# Patient Record
Sex: Female | Born: 1960 | Race: White | Hispanic: No | Marital: Married | Smoking: Never smoker
Health system: Southern US, Community
[De-identification: ages and names within clinical notes are randomized; demographics above are authoritative.]

## PROBLEM LIST (undated history)

## (undated) DIAGNOSIS — M899 Disorder of bone, unspecified: Secondary | ICD-10-CM

## (undated) DIAGNOSIS — E079 Disorder of thyroid, unspecified: Secondary | ICD-10-CM

## (undated) DIAGNOSIS — M810 Age-related osteoporosis without current pathological fracture: Secondary | ICD-10-CM

## (undated) DIAGNOSIS — M949 Disorder of cartilage, unspecified: Secondary | ICD-10-CM

## (undated) HISTORY — DX: Disorder of thyroid, unspecified: E07.9

## (undated) HISTORY — PX: COLONOSCOPY: SHX174

## (undated) HISTORY — DX: Disorder of bone, unspecified: M89.9

## (undated) HISTORY — DX: Age-related osteoporosis without current pathological fracture: M81.0

## (undated) HISTORY — DX: Disorder of cartilage, unspecified: M94.9

---

## 1996-04-25 HISTORY — PX: TUBAL LIGATION: SHX77

## 1998-11-05 ENCOUNTER — Other Ambulatory Visit: Admission: RE | Admit: 1998-11-05 | Discharge: 1998-11-05 | Payer: Self-pay | Admitting: Obstetrics and Gynecology

## 1999-12-25 ENCOUNTER — Encounter: Payer: Self-pay | Admitting: Internal Medicine

## 1999-12-25 LAB — HM MAMMOGRAPHY

## 2000-01-12 ENCOUNTER — Other Ambulatory Visit: Admission: RE | Admit: 2000-01-12 | Discharge: 2000-01-12 | Payer: Self-pay | Admitting: Obstetrics and Gynecology

## 2002-03-25 ENCOUNTER — Other Ambulatory Visit: Admission: RE | Admit: 2002-03-25 | Discharge: 2002-03-25 | Payer: Self-pay | Admitting: Obstetrics and Gynecology

## 2003-03-28 ENCOUNTER — Other Ambulatory Visit: Admission: RE | Admit: 2003-03-28 | Discharge: 2003-03-28 | Payer: Self-pay | Admitting: Obstetrics and Gynecology

## 2004-05-03 ENCOUNTER — Other Ambulatory Visit: Admission: RE | Admit: 2004-05-03 | Discharge: 2004-05-03 | Payer: Self-pay | Admitting: Obstetrics and Gynecology

## 2005-05-19 ENCOUNTER — Other Ambulatory Visit: Admission: RE | Admit: 2005-05-19 | Discharge: 2005-05-19 | Payer: Self-pay | Admitting: Obstetrics and Gynecology

## 2006-01-02 ENCOUNTER — Ambulatory Visit: Payer: Self-pay | Admitting: Internal Medicine

## 2006-04-06 ENCOUNTER — Ambulatory Visit: Payer: Self-pay | Admitting: Internal Medicine

## 2006-09-25 ENCOUNTER — Encounter: Payer: Self-pay | Admitting: Internal Medicine

## 2007-01-09 ENCOUNTER — Ambulatory Visit: Payer: Self-pay | Admitting: Internal Medicine

## 2007-01-09 DIAGNOSIS — N943 Premenstrual tension syndrome: Secondary | ICD-10-CM | POA: Insufficient documentation

## 2007-01-09 DIAGNOSIS — E039 Hypothyroidism, unspecified: Secondary | ICD-10-CM | POA: Insufficient documentation

## 2007-01-09 DIAGNOSIS — M899 Disorder of bone, unspecified: Secondary | ICD-10-CM

## 2007-01-09 DIAGNOSIS — M810 Age-related osteoporosis without current pathological fracture: Secondary | ICD-10-CM | POA: Insufficient documentation

## 2007-01-09 DIAGNOSIS — M949 Disorder of cartilage, unspecified: Secondary | ICD-10-CM

## 2007-01-10 LAB — CONVERTED CEMR LAB: Free T4: 0.7 ng/dL (ref 0.6–1.6)

## 2007-03-14 ENCOUNTER — Encounter: Payer: Self-pay | Admitting: Internal Medicine

## 2007-11-16 ENCOUNTER — Ambulatory Visit: Payer: Self-pay | Admitting: Family Medicine

## 2007-11-16 LAB — CONVERTED CEMR LAB
Nitrite: NEGATIVE
Protein, U semiquant: NEGATIVE
Urobilinogen, UA: 0.2
pH: 6.5

## 2007-11-17 ENCOUNTER — Encounter: Payer: Self-pay | Admitting: Family Medicine

## 2008-08-12 ENCOUNTER — Ambulatory Visit: Payer: Self-pay | Admitting: Internal Medicine

## 2008-08-12 DIAGNOSIS — R1033 Periumbilical pain: Secondary | ICD-10-CM | POA: Insufficient documentation

## 2008-08-14 LAB — CONVERTED CEMR LAB
ALT: 21 units/L (ref 0–35)
AST: 24 units/L (ref 0–37)
Albumin: 3.9 g/dL (ref 3.5–5.2)
Basophils Absolute: 0 10*3/uL (ref 0.0–0.1)
Basophils Relative: 0.4 % (ref 0.0–3.0)
Calcium: 9.2 mg/dL (ref 8.4–10.5)
Chloride: 106 meq/L (ref 96–112)
Eosinophils Relative: 1.4 % (ref 0.0–5.0)
HCT: 32.5 % — ABNORMAL LOW (ref 36.0–46.0)
Hemoglobin: 11.1 g/dL — ABNORMAL LOW (ref 12.0–15.0)
Lymphs Abs: 1.5 10*3/uL (ref 0.7–4.0)
Monocytes Relative: 8.4 % (ref 3.0–12.0)
Neutro Abs: 3.6 10*3/uL (ref 1.4–7.7)
Phosphorus: 4.2 mg/dL (ref 2.3–4.6)
Potassium: 4.6 meq/L (ref 3.5–5.1)
RDW: 13.1 % (ref 11.5–14.6)
TSH: 2.23 microintl units/mL (ref 0.35–5.50)
Total Protein: 6.9 g/dL (ref 6.0–8.3)

## 2008-09-04 ENCOUNTER — Ambulatory Visit: Payer: Self-pay | Admitting: Internal Medicine

## 2008-09-04 DIAGNOSIS — R198 Other specified symptoms and signs involving the digestive system and abdomen: Secondary | ICD-10-CM | POA: Insufficient documentation

## 2009-03-06 ENCOUNTER — Encounter: Admission: RE | Admit: 2009-03-06 | Discharge: 2009-03-06 | Payer: Self-pay | Admitting: Obstetrics and Gynecology

## 2011-09-20 IMAGING — MG MM BREAST STEREO BIOPSY*L*
2 series · 2 of 2 positions shown · non-contrast
Comparison: none

Addendum Begins

Histologic evaluation demonstrates fibrocystic changes with
adenosis and microcalcifications.  No malignancy or atypia is
noted.  This is concordant with the imaging findings.  Results were
discussed with the patient and her husband.  She reports no
complications from the procedure.  Her breast was examined.  There
is ecchymosis at the biopsy site but no sign of hematoma or
infection.  Yearly screening mammography is suggested.
Addendum Ends
CLINICAL DATA: Microcalcifications left breast

[L CC]
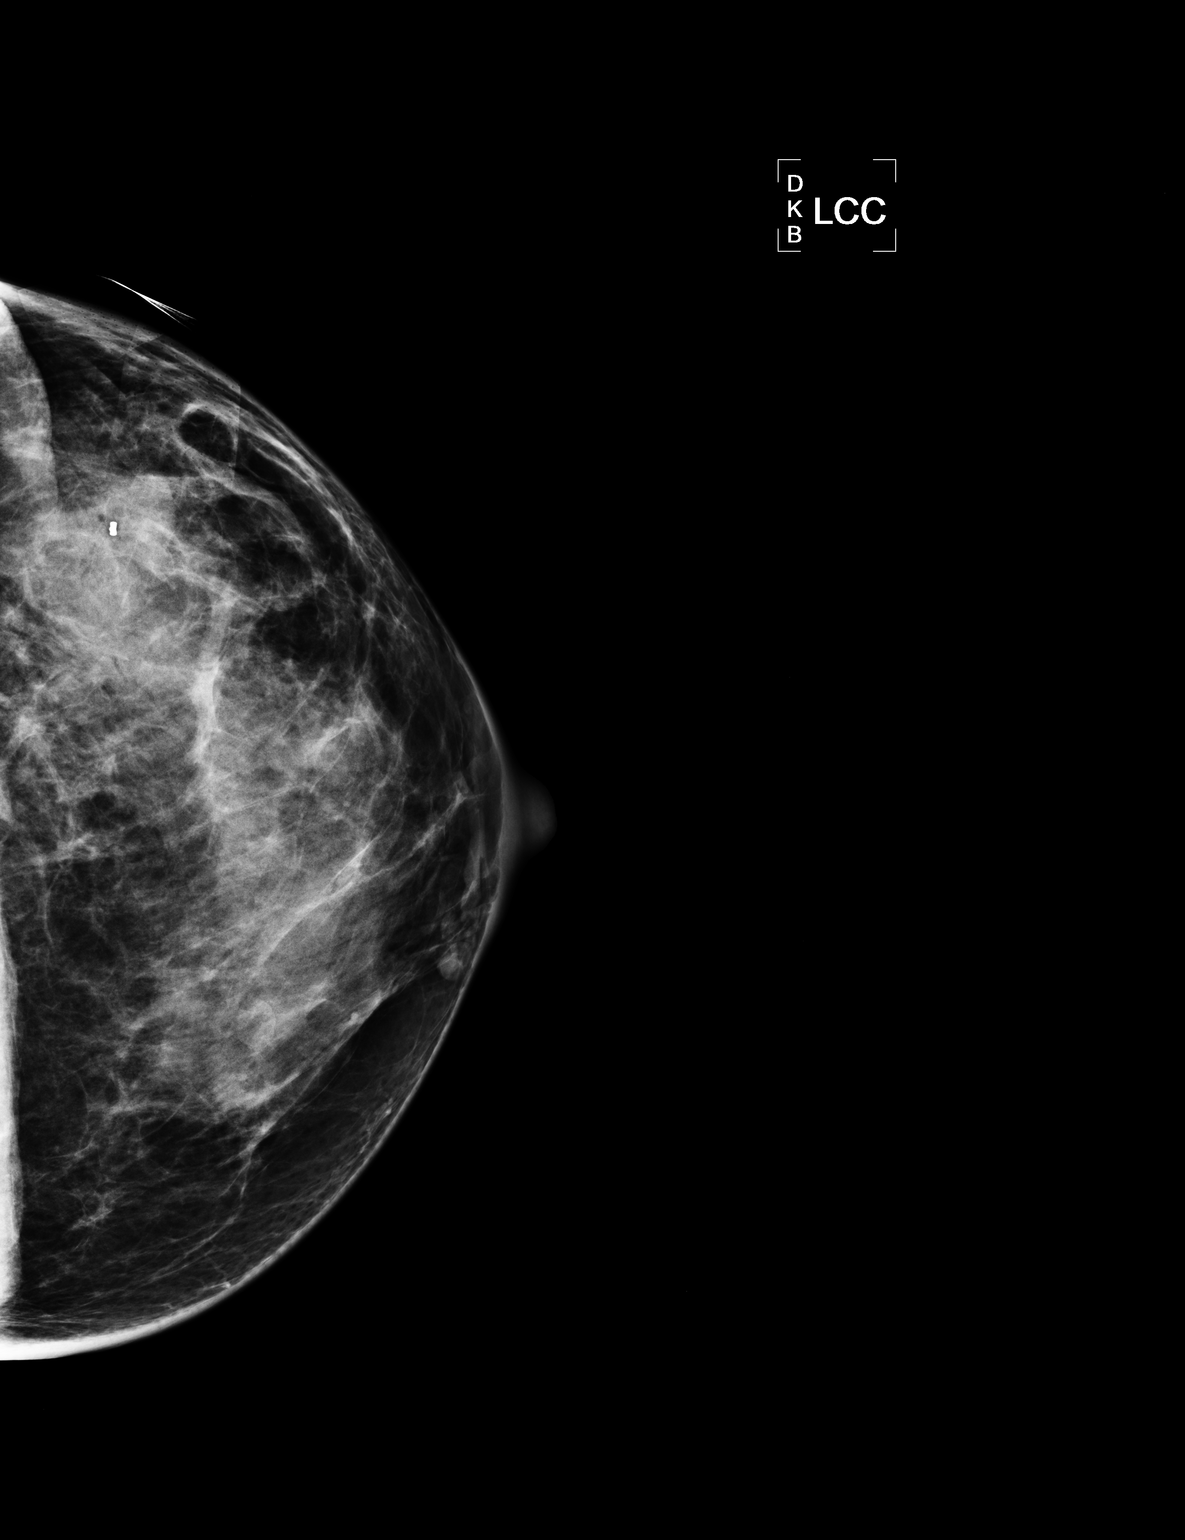

[L ML]
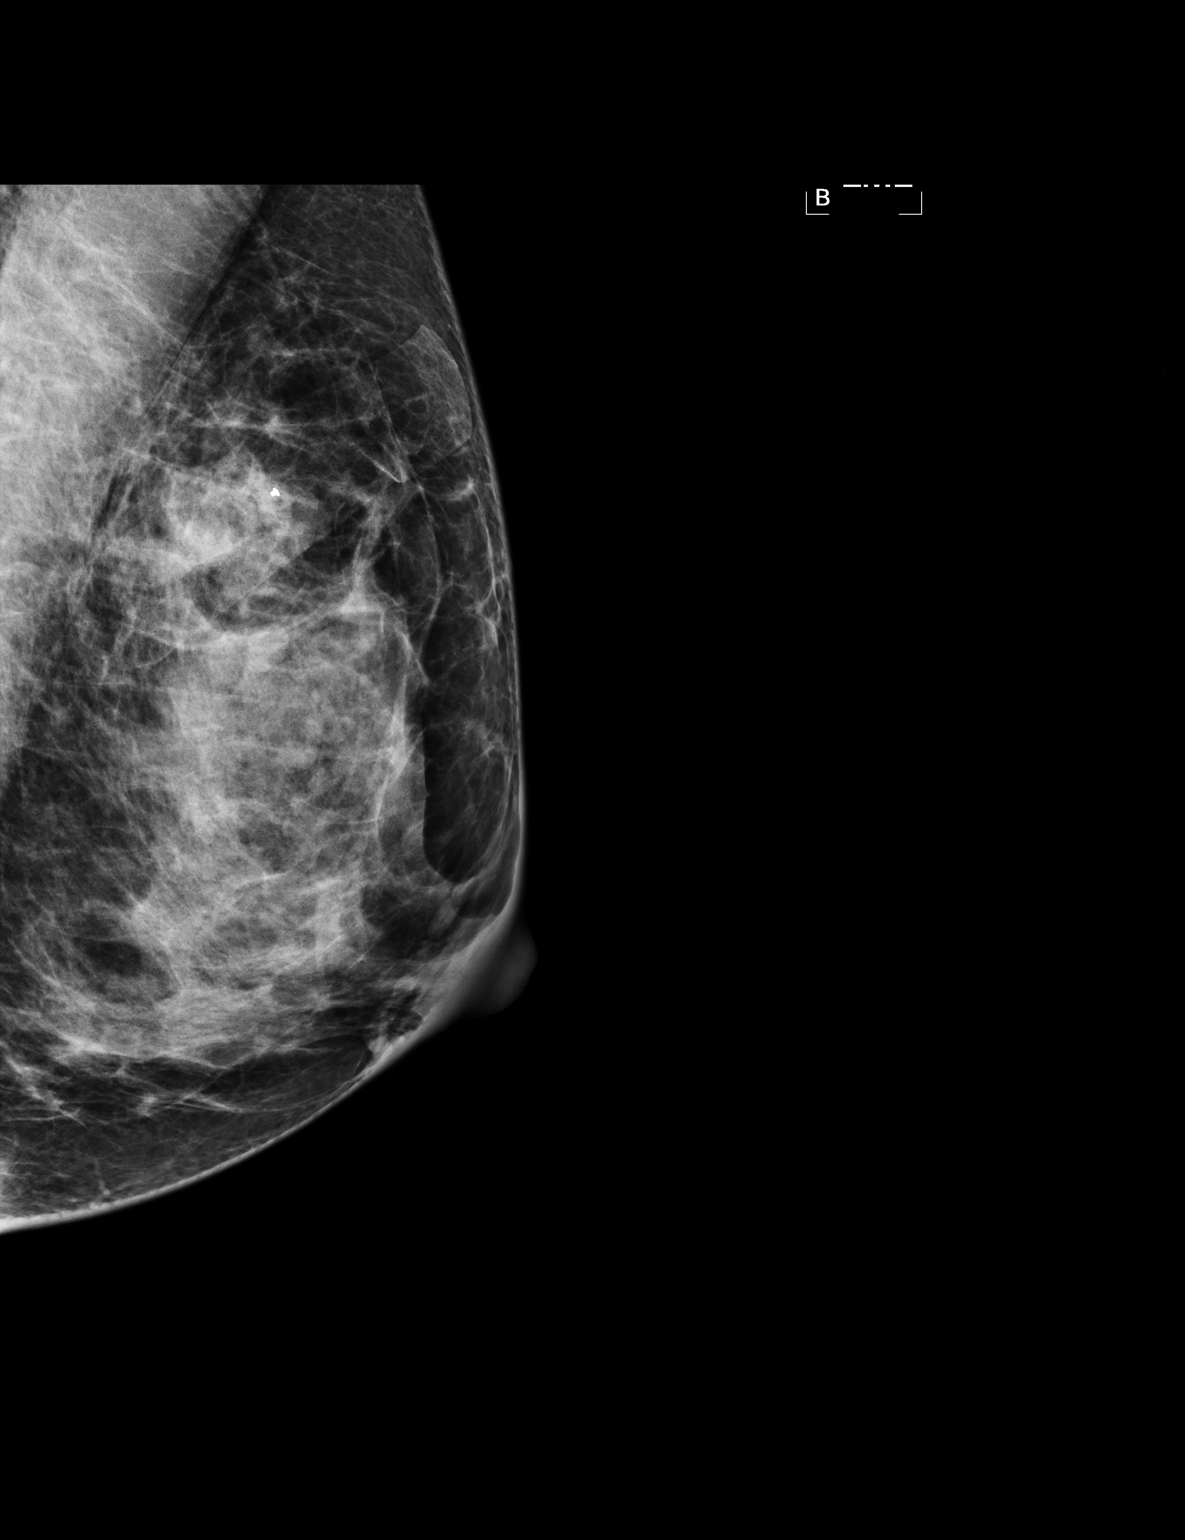

[2 of 2 positions shown; findings below may reference images not displayed]

STEREOTACTIC-GUIDED VACUUM ASSISTED BIOPSY OF THE LEFT BREAST AND
SPECIMEN RADIOGRAPH

I met with the patient, and we discussed the procedure of
stereotactic-guided biopsy, including risks, benefits, and
alternatives.  Specifically, we discussed the risks of infection,
bleeding, tissue injury, clip migration, and inadequate sampling.
Informed, written consent was given.

Using sterile technique, 2% lidocaine, stereotactic guidance, and a
9 gauge vacuum assisted device, biopsy was performed of cluster of
indeterminate microcalcifications upper outer quadrant left breast.
Specimen radiograph was performed, showing inclusion of
calcifications of question within the specimen.  Specimens with
calcifications are identified for pathology.

At the conclusion of the procedure, a tissue marker clip was
deployed into the biopsy cavity.  Follow-up 2-view mammogram
confirmed clip in the area of concern.
IMPRESSION: Stereotactic-guided biopsy of left breast.  No apparent
complications.

## 2011-09-20 IMAGING — MG MM DIAGNOSTIC UNILATERAL L
2 series · 2 of 2 positions shown · non-contrast
Comparison: [DATE] [DATE], [DATE], [DATE] [DATE], [DATE], [DATE] [DATE], [DATE]

CLINICAL DATA: Called back from screening mammogram for
calcifications left breast

DIGITAL DIAGNOSTIC LEFT MAMMOGRAM March 06, 2009

[L CC]
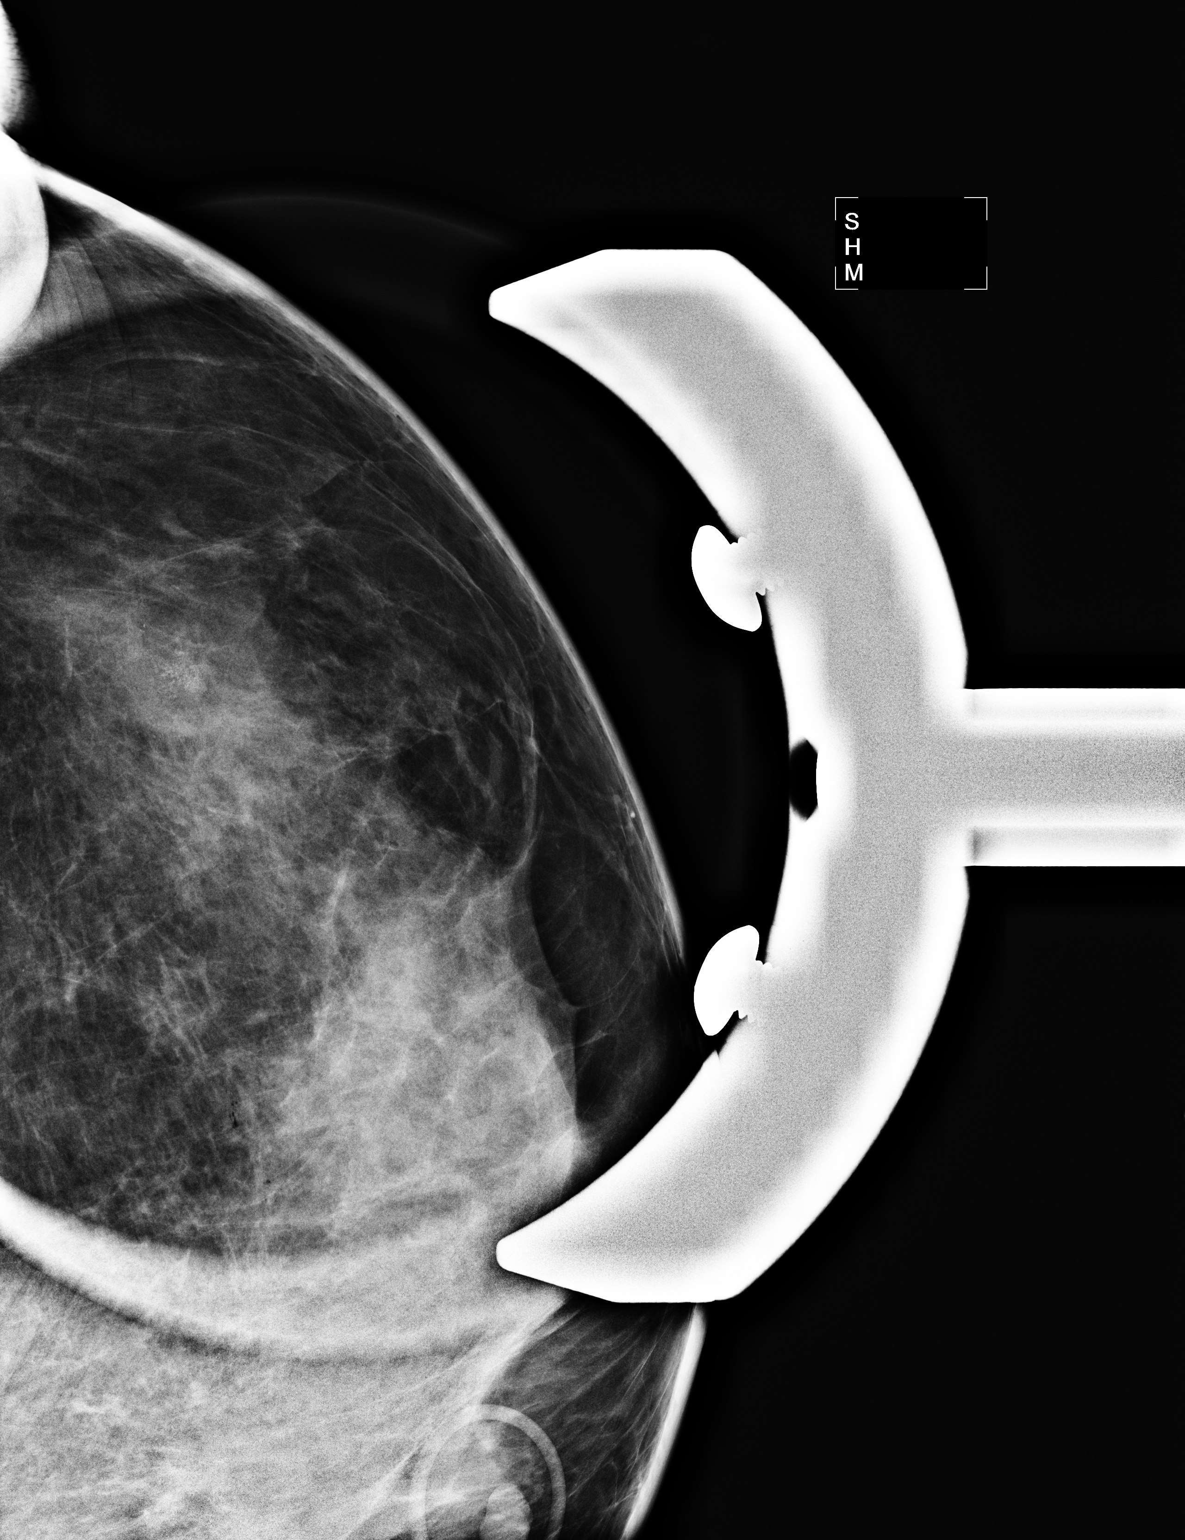

[L ML]
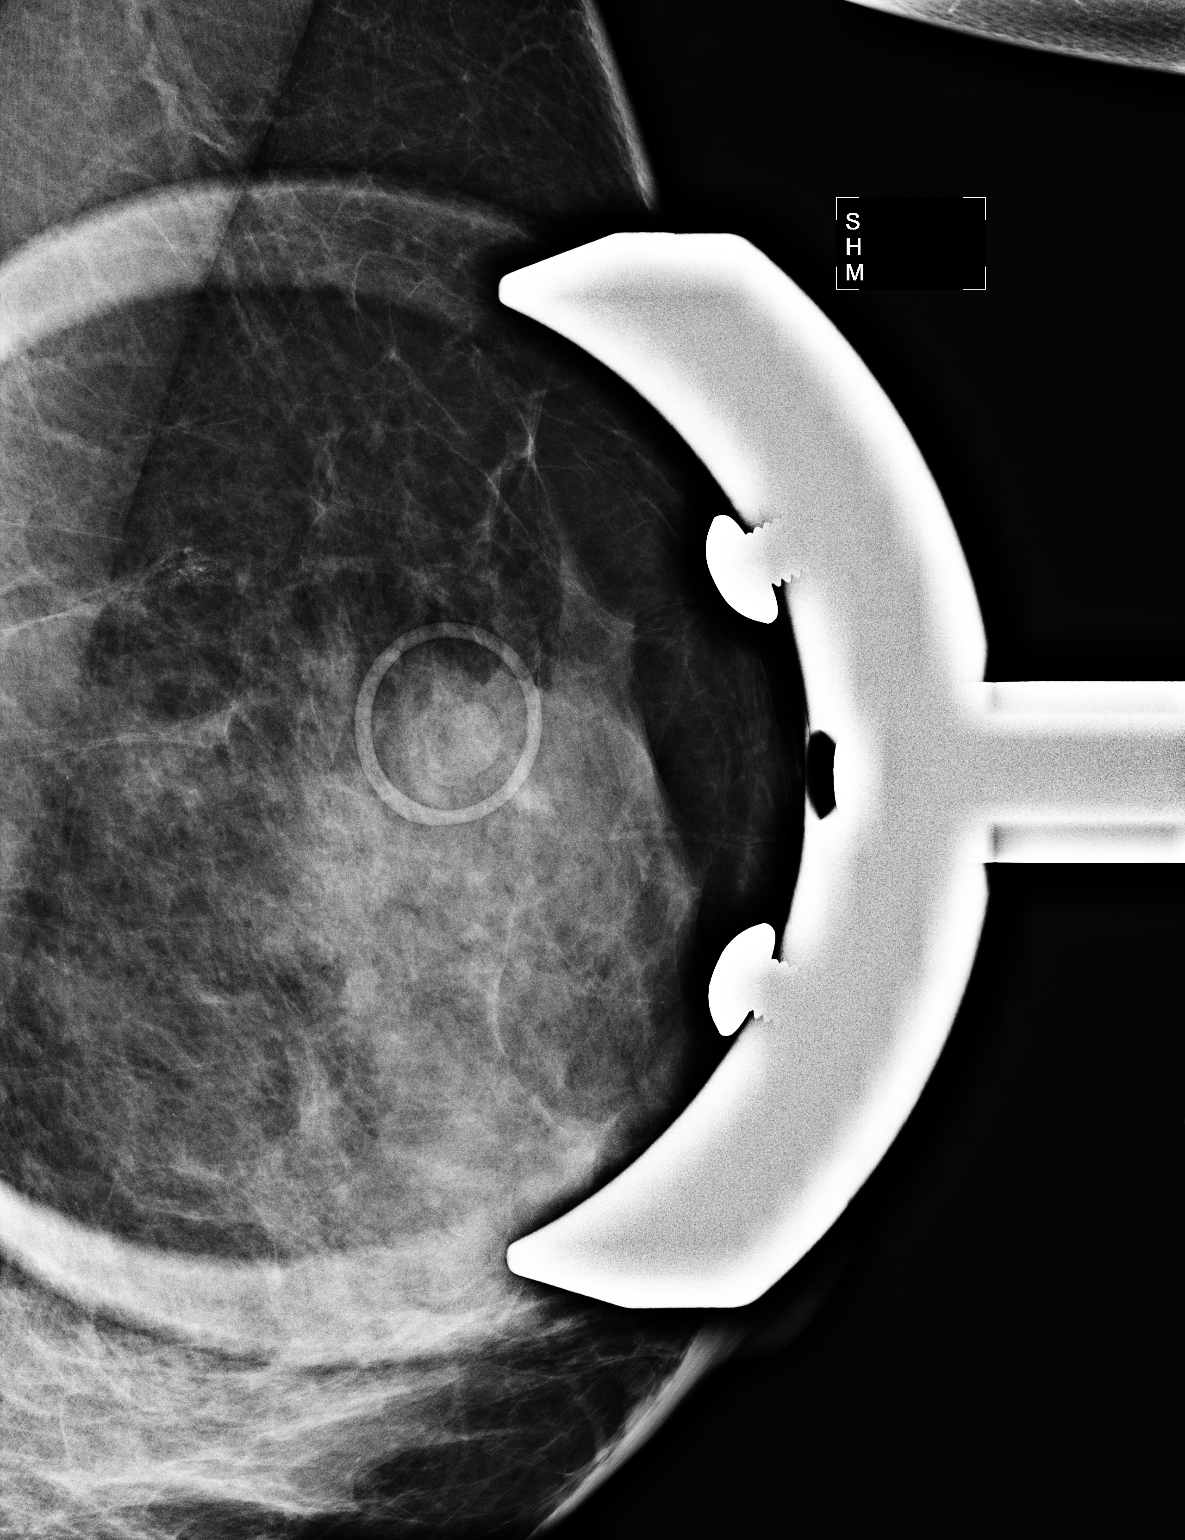

[2 of 2 positions shown; findings below may reference images not displayed]

FINDINGS: Spot magnification CC and lateral view of the left
breast are submitted.  Previously noted calcifications in the
posterior upper outer quadrant left breast are indeterminate.
IMPRESSION: Suspicious findings, recommend stereotactic biopsy of left breast

BI-RADS CATEGORY 4:  Suspicious abnormality - biopsy should be
considered.

## 2012-04-10 ENCOUNTER — Ambulatory Visit (INDEPENDENT_AMBULATORY_CARE_PROVIDER_SITE_OTHER): Payer: BC Managed Care – PPO | Admitting: Internal Medicine

## 2012-04-10 ENCOUNTER — Encounter: Payer: Self-pay | Admitting: Internal Medicine

## 2012-04-10 VITALS — BP 112/70 | HR 70 | Temp 97.5°F | Ht 63.5 in | Wt 114.0 lb

## 2012-04-10 DIAGNOSIS — E039 Hypothyroidism, unspecified: Secondary | ICD-10-CM

## 2012-04-10 DIAGNOSIS — Z1211 Encounter for screening for malignant neoplasm of colon: Secondary | ICD-10-CM

## 2012-04-10 DIAGNOSIS — Z23 Encounter for immunization: Secondary | ICD-10-CM

## 2012-04-10 DIAGNOSIS — Z0000 Encounter for general adult medical examination without abnormal findings: Secondary | ICD-10-CM | POA: Insufficient documentation

## 2012-04-10 LAB — CBC WITH DIFFERENTIAL/PLATELET
Basophils Absolute: 0 10*3/uL (ref 0.0–0.1)
Basophils Relative: 0.3 % (ref 0.0–3.0)
Eosinophils Absolute: 0 10*3/uL (ref 0.0–0.7)
HCT: 33.8 % — ABNORMAL LOW (ref 36.0–46.0)
Hemoglobin: 11 g/dL — ABNORMAL LOW (ref 12.0–15.0)
Lymphs Abs: 1 10*3/uL (ref 0.7–4.0)
MCHC: 32.5 g/dL (ref 30.0–36.0)
Neutro Abs: 3.3 10*3/uL (ref 1.4–7.7)
RDW: 15.3 % — ABNORMAL HIGH (ref 11.5–14.6)

## 2012-04-10 LAB — HEPATIC FUNCTION PANEL
ALT: 15 U/L (ref 0–35)
Albumin: 4.2 g/dL (ref 3.5–5.2)
Alkaline Phosphatase: 61 U/L (ref 39–117)
Total Protein: 7.3 g/dL (ref 6.0–8.3)

## 2012-04-10 LAB — BASIC METABOLIC PANEL
CO2: 26 mEq/L (ref 19–32)
Chloride: 102 mEq/L (ref 96–112)
Glucose, Bld: 92 mg/dL (ref 70–99)
Potassium: 3.9 mEq/L (ref 3.5–5.1)
Sodium: 135 mEq/L (ref 135–145)

## 2012-04-10 LAB — LIPID PANEL: Cholesterol: 169 mg/dL (ref 0–200)

## 2012-04-10 NOTE — Progress Notes (Signed)
Subjective:    Patient ID: Yvonne Hodge, female    DOB: 09/23/1960, 51 y.o.   MRN: [id number]  HPI Here for physical Hasn't been in since 2010--- with the abdominal symptoms Never had the colonoscopy--- cost her $700 she didn't have Abdominal symptoms are better  Still sees Dr Henderson Cloud Seen 12/9 this year Had pap and mammo---all good  Has not been on thyroid meds for about 2 years No changes in hair or nails Some weight loss---relates to nerves with some issues with son  Some pain in neck for about a week Pain with rotation Radiates with tingling into occiput  No current outpatient prescriptions on file prior to visit.    Allergies  Allergen Reactions  . Sulfonamide Derivatives     Past Medical History  Diagnosis Date  . Thyroid disease   . Disorder of bone and cartilage, unspecified     Past Surgical History  Procedure Date  . Cesarean section   . Tubal ligation     Family History  Problem Relation Age of Onset  . Cancer Mother     breast cancer  . Hypothyroidism Mother   . Hypertension Father   . Hypothyroidism Sister     History   Social History  . Marital Status: Married    Spouse Name: N/A    Number of Children: 2  . Years of Education: N/A   Occupational History  . [employer]    Social History Main Topics  . Smoking status: Never Smoker   . Smokeless tobacco: Never Used  . Alcohol Use: Yes  . Drug Use: No  . Sexually Active: Not on file   Other Topics Concern  . Not on file   Social History Narrative  . No narrative on file   Review of Systems  Constitutional: Negative for appetite change and fatigue.       Wears seat belt  HENT: Positive for hearing loss. Negative for congestion, rhinorrhea, dental problem and tinnitus.        Mild hearing problems Regular with dentist  Eyes: Negative for visual disturbance.       No diplopia or unilateral vision loss  Respiratory: Negative for cough, chest  tightness and shortness of breath.   Cardiovascular: Negative for chest pain, palpitations and leg swelling.  Gastrointestinal: Negative for nausea, vomiting, abdominal pain, constipation and blood in stool.       No heartburn  Genitourinary: Negative for urgency, frequency, difficulty urinating and dyspareunia.       Skips some periods now No sig cyclic mood issues Has had occ stress incontinence  Musculoskeletal: Positive for back pain. Negative for joint swelling and arthralgias.       Mild back pain if up on her feet for a long time  Skin: Negative for rash.       No suspicious lesions  Neurological: Positive for headaches. Negative for dizziness, syncope, weakness, light-headedness and numbness.       Some sinus headaches--behind right eye. BCs or sinus med will help  Hematological: Negative for adenopathy. Does not bruise/bleed easily.  Psychiatric/Behavioral: Positive for dysphoric mood. Negative for sleep disturbance. The patient is not nervous/anxious.        Some stress with son       Objective:   Physical Exam  Constitutional: She is oriented to person, place, and time. She appears well-developed and well-nourished. No distress.  HENT:  Head: Normocephalic and atraumatic.  Right Ear: External ear normal.  Left Ear:  External ear normal.  Mouth/Throat: Oropharynx is clear and moist. No oropharyngeal exudate.  Eyes: Conjunctivae normal and EOM are normal. Pupils are equal, round, and reactive to light.  Neck: Normal range of motion. Neck supple. No thyromegaly present.  Cardiovascular: Normal rate, regular rhythm, normal heart sounds and intact distal pulses.  Exam reveals no gallop.   No murmur heard. Pulmonary/Chest: Effort normal and breath sounds normal. No respiratory distress. She has no wheezes. She has no rales.  Abdominal: Soft. There is no tenderness.  Musculoskeletal: She exhibits no edema and no tenderness.  Lymphadenopathy:    She has no cervical adenopathy.   Neurological: She is alert and oriented to person, place, and time.  Skin: No rash noted. No erythema.  Psychiatric: She has a normal mood and affect. Her behavior is normal.          Assessment & Plan:

## 2012-04-10 NOTE — Addendum Note (Signed)
Addended by: Sueanne Margarita on: 04/10/2012 10:49 AM   Modules accepted: Orders

## 2012-04-10 NOTE — Assessment & Plan Note (Signed)
Hasn't been on meds for some time Will check labs

## 2012-04-10 NOTE — Assessment & Plan Note (Signed)
Healthy Discussed fitness Check cholesterol and glucose Tdap colonoscopy

## 2012-05-07 ENCOUNTER — Ambulatory Visit (AMBULATORY_SURGERY_CENTER): Payer: BC Managed Care – PPO | Admitting: *Deleted

## 2012-05-07 ENCOUNTER — Encounter: Payer: Self-pay | Admitting: Internal Medicine

## 2012-05-07 VITALS — Ht 63.5 in | Wt 117.0 lb

## 2012-05-07 DIAGNOSIS — Z1211 Encounter for screening for malignant neoplasm of colon: Secondary | ICD-10-CM

## 2012-05-07 MED ORDER — NA SULFATE-K SULFATE-MG SULF 17.5-3.13-1.6 GM/177ML PO SOLN: ORAL | Status: DC

## 2012-05-29 ENCOUNTER — Encounter: Payer: Self-pay | Admitting: Internal Medicine

## 2012-05-29 ENCOUNTER — Ambulatory Visit (AMBULATORY_SURGERY_CENTER): Payer: BC Managed Care – PPO | Admitting: Internal Medicine

## 2012-05-29 VITALS — BP 120/79 | HR 58 | Temp 96.9°F | Resp 16 | Ht 63.5 in | Wt 117.0 lb

## 2012-05-29 DIAGNOSIS — K5730 Diverticulosis of large intestine without perforation or abscess without bleeding: Secondary | ICD-10-CM

## 2012-05-29 DIAGNOSIS — Z1211 Encounter for screening for malignant neoplasm of colon: Secondary | ICD-10-CM

## 2012-05-29 MED ORDER — SODIUM CHLORIDE 0.9 % IV SOLN: 500.0000 mL | INTRAVENOUS | Status: DC

## 2012-05-29 NOTE — Progress Notes (Signed)
No complaints noted in the recovery room. Maw  Patient did not experience any of the following events: a burn prior to discharge; a fall within the facility; wrong site/side/patient/procedure/implant event; or a hospital transfer or hospital admission upon discharge from the facility. (G8907) Patient did not have preoperative order for IV antibiotic SSI prophylaxis. (G8918)  

## 2012-05-29 NOTE — Patient Instructions (Addendum)
The colonoscopy showed diverticulosis - thickened muscles and pockets in the colon wall. Increasing fiber can help this - we will provide a high fiber diet instruction sheet that you can try. It should help you avoid constipation and cramps.  No polyps or cancer seen. Great prep!  Next routine colonoscopy in 10 years (2024).  Thank you for choosing me and [facility].  Iva Boop, MD, [facility]     Handouts were given to your care partner on diverticulosis and a high fiber diet.  You may resume your current medications today.  Please call if any questions or concerns.    YOU HAD AN ENDOSCOPIC PROCEDURE TODAY AT THE [facility]: Refer to the procedure report that was given to you for any specific questions about what was found during the examination.  If the procedure report does not answer your questions, please call your gastroenterologist to clarify.  If you requested that your care partner not be given the details of your procedure findings, then the procedure report has been included in a sealed envelope for you to review at your convenience later.  YOU SHOULD EXPECT: Some feelings of bloating in the abdomen. Passage of more gas than usual.  Walking can help get rid of the air that was put into your GI tract during the procedure and reduce the bloating. If you had a lower endoscopy (such as a colonoscopy or flexible sigmoidoscopy) you may notice spotting of blood in your stool or on the toilet paper. If you underwent a bowel prep for your procedure, then you may not have a normal bowel movement for a few days.  DIET: Your first meal following the procedure should be a light meal and then it is ok to progress to your normal diet.  A half-sandwich or bowl of soup is an example of a good first meal.  Heavy or fried foods are harder to digest and may make you feel nauseous or bloated.  Likewise meals heavy in dairy and vegetables can cause extra gas to form and this can  also increase the bloating.  Drink plenty of fluids but you should avoid alcoholic beverages for 24 hours.  ACTIVITY: Your care partner should take you home directly after the procedure.  You should plan to take it easy, moving slowly for the rest of the day.  You can resume normal activity the day after the procedure however you should NOT DRIVE or use heavy machinery for 24 hours (because of the sedation medicines used during the test).    SYMPTOMS TO REPORT IMMEDIATELY: A gastroenterologist can be reached at any hour.  During normal business hours, 8:30 AM to 5:00 PM Monday through Friday, call [phone].  After hours and on weekends, please call the GI answering service at [phone] who will take a message and have the physician on call contact you.   Following lower endoscopy (colonoscopy or flexible sigmoidoscopy):  Excessive amounts of blood in the stool  Significant tenderness or worsening of abdominal pains  Swelling of the abdomen that is new, acute  Fever of 100F or higher    FOLLOW UP: If any biopsies were taken you will be contacted by phone or by letter within the next 1-3 weeks.  Call your gastroenterologist if you have not heard about the biopsies in 3 weeks.  Our staff will call the home number listed on your records the next business day following your procedure to check on you and address any questions or  concerns that you may have at that time regarding the information given to you following your procedure. This is a courtesy call and so if there is no answer at the home number and we have not heard from you through the emergency physician on call, we will assume that you have returned to your regular daily activities without incident.  SIGNATURES/CONFIDENTIALITY: You and/or your care partner have signed paperwork which will be entered into your electronic medical record.  These signatures attest to the fact that that the information above on your After Visit  Summary has been reviewed and is understood.  Full responsibility of the confidentiality of this discharge information lies with you and/or your care-partner.

## 2012-05-29 NOTE — Op Note (Signed)
[facility] [address] [place], [address]   COLONOSCOPY PROCEDURE REPORT  PATIENT: Yvonne Hodge, Yvonne Hodge  MR#: [id number] BIRTHDATE: [birth year] , 51  yrs. old GENDER: Female ENDOSCOPIST: Iva Boop, MD, [facility] REFERRED [id number] Alphonsus Sias, M.D. PROCEDURE DATE:  05/29/2012 PROCEDURE:   Colonoscopy, diagnostic ASA CLASS:   Class I INDICATIONS:average risk screening. MEDICATIONS: propofol (Diprivan) 200mg  IV, MAC sedation, administered by CRNA, and These medications were titrated to patient response per physician's verbal order  DESCRIPTION OF PROCEDURE:   After the risks benefits and alternatives of the procedure were thoroughly explained, informed consent was obtained.  A digital rectal exam revealed no abnormalities of the rectum.   The LB [id number]  endoscope was introduced through the anus and advanced to the cecum, which was identified by both the appendix and ileocecal valve. No adverse events experienced.   The quality of the prep was Suprep excellent The instrument was then slowly withdrawn as the colon was fully examined.      COLON FINDINGS: There was moderate diverticulosis noted in the sigmoid colon with associated muscular hypertrophy and luminal narrowing.   The colon mucosa was otherwise normal.   A right colon retroflexion was performed.  Retroflexed views revealed no abnormalities. The time to cecum=3 minutes 49 seconds.  Withdrawal time=8 minutes 45 seconds.  The scope was withdrawn and the procedure completed. COMPLICATIONS: There were no complications.  ENDOSCOPIC IMPRESSION: 1.   There was moderate diverticulosis noted in the sigmoid colon 2.   The colon mucosa was otherwise normal with excellent prep  RECOMMENDATIONS: 1.  High fiber diet 2.  Repeat colonoscopy 10 years (2024)   eSigned:  Iva Boop, MD, [facility] 05/29/2012 9:05 AM   cc: Karie Schwalbe, MD and The Patient

## 2012-05-30 ENCOUNTER — Telehealth: Payer: Self-pay | Admitting: *Deleted

## 2012-05-30 NOTE — Telephone Encounter (Signed)
  Follow up Call-  Call back number 05/29/2012  Post procedure Call Back phone  # [phone]  Permission to leave phone message Yes     Patient questions:  Do you have a fever, pain , or abdominal swelling? no Pain Score  0 *  Have you tolerated food without any problems? yes  Have you been able to return to your normal activities? yes  Do you have any questions about your discharge instructions: Diet   no Medications  no Follow up visit  no  Do you have questions or concerns about your Care? no  Actions: * If pain score is 4 or above: No action needed, pain <4.

## 2012-07-13 ENCOUNTER — Ambulatory Visit: Payer: BC Managed Care – PPO | Admitting: Family Medicine

## 2013-02-28 ENCOUNTER — Other Ambulatory Visit: Payer: Self-pay

## 2013-05-17 ENCOUNTER — Encounter: Payer: Self-pay | Admitting: Internal Medicine

## 2013-05-17 ENCOUNTER — Ambulatory Visit (INDEPENDENT_AMBULATORY_CARE_PROVIDER_SITE_OTHER): Payer: BC Managed Care – PPO | Admitting: Internal Medicine

## 2013-05-17 VITALS — BP 122/78 | HR 72 | Temp 98.1°F | Ht 64.0 in | Wt 128.0 lb

## 2013-05-17 DIAGNOSIS — Z0000 Encounter for general adult medical examination without abnormal findings: Secondary | ICD-10-CM

## 2013-05-17 DIAGNOSIS — E039 Hypothyroidism, unspecified: Secondary | ICD-10-CM

## 2013-05-17 LAB — CBC WITH DIFFERENTIAL/PLATELET
BASOS ABS: 0 10*3/uL (ref 0.0–0.1)
Basophils Relative: 0.4 % (ref 0.0–3.0)
Eosinophils Absolute: 0.1 10*3/uL (ref 0.0–0.7)
Eosinophils Relative: 1.8 % (ref 0.0–5.0)
HCT: 33.7 % — ABNORMAL LOW (ref 36.0–46.0)
HEMOGLOBIN: 11 g/dL — AB (ref 12.0–15.0)
LYMPHS PCT: 30.8 % (ref 12.0–46.0)
Lymphs Abs: 1.9 10*3/uL (ref 0.7–4.0)
MCHC: 32.8 g/dL (ref 30.0–36.0)
MCV: 82.5 fl (ref 78.0–100.0)
MONOS PCT: 8.6 % (ref 3.0–12.0)
Monocytes Absolute: 0.5 10*3/uL (ref 0.1–1.0)
Neutro Abs: 3.5 10*3/uL (ref 1.4–7.7)
Neutrophils Relative %: 58.4 % (ref 43.0–77.0)
PLATELETS: 290 10*3/uL (ref 150.0–400.0)
RBC: 4.08 Mil/uL (ref 3.87–5.11)
RDW: 14.3 % (ref 11.5–14.6)
WBC: 6 10*3/uL (ref 4.5–10.5)

## 2013-05-17 LAB — COMPREHENSIVE METABOLIC PANEL
ALT: 16 U/L (ref 0–35)
AST: 20 U/L (ref 0–37)
Albumin: 4 g/dL (ref 3.5–5.2)
Alkaline Phosphatase: 71 U/L (ref 39–117)
BUN: 15 mg/dL (ref 6–23)
CALCIUM: 9.2 mg/dL (ref 8.4–10.5)
CHLORIDE: 103 meq/L (ref 96–112)
CO2: 28 meq/L (ref 19–32)
CREATININE: 0.8 mg/dL (ref 0.4–1.2)
GFR: 85.96 mL/min (ref >60.00)
GLUCOSE: 85 mg/dL (ref 70–99)
Potassium: 4.1 mEq/L (ref 3.5–5.1)
Sodium: 136 mEq/L (ref 135–145)
TOTAL PROTEIN: 7.1 g/dL (ref 6.0–8.3)
Total Bilirubin: 0.5 mg/dL (ref 0.3–1.2)

## 2013-05-17 LAB — LIPID PANEL
CHOLESTEROL: 165 mg/dL (ref 0–200)
HDL: 59 mg/dL (ref >39.00)
LDL Cholesterol: 88 mg/dL (ref 0–99)
TRIGLYCERIDES: 92 mg/dL (ref 0.0–149.0)
Total CHOL/HDL Ratio: 3
VLDL: 18.4 mg/dL (ref 0.0–40.0)

## 2013-05-17 LAB — T4, FREE: FREE T4: 0.65 ng/dL (ref 0.60–1.60)

## 2013-05-17 LAB — TSH: TSH: 4.27 u[IU]/mL (ref 0.35–5.50)

## 2013-05-17 NOTE — Patient Instructions (Signed)
Exercise to Stay Healthy Exercise helps you become and stay healthy. EXERCISE IDEAS AND TIPS Choose exercises that:  You enjoy.  Fit into your day. You do not need to exercise really hard to be healthy. You can do exercises at a slow or medium level and stay healthy. You can:  Stretch before and after working out.  Try yoga, Pilates, or tai chi.  Lift weights.  Walk fast, swim, jog, run, climb stairs, bicycle, dance, or rollerskate.  Take aerobic classes. Exercises that burn about 150 calories:  Running 1  miles in 15 minutes.  Playing volleyball for 45 to 60 minutes.  Washing and waxing a car for 45 to 60 minutes.  Playing touch football for 45 minutes.  Walking 1  miles in 35 minutes.  Pushing a stroller 1  miles in 30 minutes.  Playing basketball for 30 minutes.  Raking leaves for 30 minutes.  Bicycling 5 miles in 30 minutes.  Walking 2 miles in 30 minutes.  Dancing for 30 minutes.  Shoveling snow for 15 minutes.  Swimming laps for 20 minutes.  Walking up stairs for 15 minutes.  Bicycling 4 miles in 15 minutes.  Gardening for 30 to 45 minutes.  Jumping rope for 15 minutes.  Washing windows or floors for 45 to 60 minutes. Document Released: 05/14/2010 Document Revised: 07/04/2011 Document Reviewed: 05/14/2010 ExitCare Patient Information 2014 [employer].  

## 2013-05-17 NOTE — Progress Notes (Signed)
Subjective:    Patient ID: Yvonne Hodge, female    DOB: 10-09-1960, 53 y.o.   MRN: [id number]  HPI Here for physical No new concerns  Still sees Dr Henderson Cloudomblin Mammogram and pap smear 12/8  Same job Mom probably with early Alzheimers-- now living with her  Not regularly exercise Gained a few pounds  No current outpatient prescriptions on file prior to visit.   No current facility-administered medications on file prior to visit.    Allergies  Allergen Reactions  . Sulfonamide Derivatives Rash    Past Medical History  Diagnosis Date  . Disorder of bone and cartilage, unspecified   . Thyroid disease     not on meds since Dec.2013    Past Surgical History  Procedure Laterality Date  . Cesarean section  1998  . Tubal ligation  1998    during C-section    Family History  Problem Relation Age of Onset  . Cancer Mother     breast cancer  . Hypothyroidism Mother   . Alzheimer's disease Mother   . Hypertension Father   . Hypothyroidism Sister   . Colon cancer Neg Hx   . Heart disease Neg Hx   . Cancer Maternal Aunt     breast cancer    History   Social History  . Marital Status: Married    Spouse Name: N/A    Number of Children: 2  . 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 Smoking status: Never Smoker   . Smokeless tobacco: Never Used  . Alcohol Use: No  . Drug Use: No  . Sexual Activity: Not on file   Other Topics Concern  . Not on file   Social History Narrative  . No narrative on file   Review of Systems  Constitutional: Positive for unexpected weight change. Negative for fatigue.       Wears seat belt  HENT: Negative for dental problem, hearing loss and tinnitus.        Regular with dentist  Eyes: Negative for visual disturbance.       No diplopia or unilateral vision loss  Respiratory: Negative for cough, chest tightness and shortness of breath.     Cardiovascular: Negative for chest pain, palpitations and leg swelling.  Gastrointestinal: Negative for nausea, vomiting, abdominal pain, constipation and blood in stool.       No heartburn  Endocrine: Negative for cold intolerance and heat intolerance.  Genitourinary: Negative for dysuria, hematuria, difficulty urinating and dyspareunia.       Has skipped some periods  Musculoskeletal: Negative for back pain, joint swelling and myalgias.  Skin: Negative for rash.       No suspicious lesions  Allergic/Immunologic: Negative for environmental allergies and immunocompromised state.  Neurological: Positive for headaches. Negative for dizziness, syncope, weakness, light-headedness and numbness.       Occ headaches if she sleeps on pillow wrong  Hematological: Negative for adenopathy. Does not bruise/bleed easily.  Psychiatric/Behavioral: Negative for sleep disturbance and dysphoric mood. The patient is not nervous/anxious.        Objective:   Physical Exam  Constitutional: She is oriented to person, place, and time. She appears well-developed and well-nourished. No distress.  HENT:  Head: Normocephalic and atraumatic.  Right Ear: External ear normal.  Left Ear: External ear normal.  Mouth/Throat: Oropharynx is clear and moist. No oropharyngeal exudate.  Eyes: Conjunctivae and EOM are  normal. Pupils are equal, round, and reactive to light.  Neck: Normal range of motion. Neck supple. No thyromegaly present.  Cardiovascular: Normal rate, regular rhythm and intact distal pulses.  Exam reveals no gallop.   No murmur heard. Pulmonary/Chest: Effort normal and breath sounds normal. No respiratory distress. She has no wheezes. She has no rales.  Abdominal: Soft. There is no tenderness.  Musculoskeletal: She exhibits no edema and no tenderness.  Lymphadenopathy:    She has no cervical adenopathy.  Neurological: She is alert and oriented to person, place, and time.  Skin: No rash noted. No  erythema.  Benign tiny nevi  Psychiatric: She has a normal mood and affect. Her behavior is normal.          Assessment & Plan:

## 2013-05-17 NOTE — Assessment & Plan Note (Signed)
Healthy Discussed exercise UTD on preventative care

## 2013-05-17 NOTE — Progress Notes (Signed)
Pre-visit discussion using our clinic review tool. No additional management support is needed unless otherwise documented below in the visit note.  

## 2013-05-17 NOTE — Assessment & Plan Note (Signed)
Seems euthyroid without meds Will check labs 

## 2014-02-07 ENCOUNTER — Other Ambulatory Visit: Payer: Self-pay

## 2014-08-21 ENCOUNTER — Encounter: Payer: Self-pay | Admitting: Family Medicine

## 2014-08-21 ENCOUNTER — Ambulatory Visit (INDEPENDENT_AMBULATORY_CARE_PROVIDER_SITE_OTHER): Payer: BLUE CROSS/BLUE SHIELD | Admitting: Family Medicine

## 2014-08-21 VITALS — BP 130/78 | HR 77 | Temp 98.5°F | Wt 121.5 lb

## 2014-08-21 DIAGNOSIS — J069 Acute upper respiratory infection, unspecified: Secondary | ICD-10-CM

## 2014-08-21 MED ORDER — AMOXICILLIN-POT CLAVULANATE 875-125 MG PO TABS: 1.0000 | ORAL_TABLET | Freq: Two times a day (BID) | ORAL | Status: DC

## 2014-08-21 NOTE — Patient Instructions (Signed)
Use flonase in the AM (2 sprays on each side), nasal saline later in the day.  Try to limit afrin use at night.  Continue with a decongestant and drink a lot of water.   Try to get some rest.  If not better in a few days, then start the antibiotics.  Take care.

## 2014-08-21 NOTE — Assessment & Plan Note (Signed)
Nontoxic, still likely viral.  dw pt.  Okay for outpatient f/u.  Supportive care for now.  If sx persist/worsen, then start abx for presumed sinusitis.  She will likely not to need the rx, so she'll hold it for now.  She agrees.  See AVS.

## 2014-08-21 NOTE — Progress Notes (Signed)
Pre visit review using our clinic review tool, if applicable. No additional management support is needed unless otherwise documented below in the visit note.  Sx started about 2-3 days ago.  Started with ST.  Sick contact at home.  Yesterday, she has some cough, ST, rhinorrhea, stuffy nose.  Today is similar to yesterday, less HA now compared to this AM.  Used afrin last night, with some relief.  No fevers, no chills, no vomiting.  No diarrhea.  Minimal sputum.  Facial pain is most bothersome for patient.  Voice is altered.  Tried Listerine in the meantime, along with OTC cold medicine w/o much relief.    Meds, vitals, and allergies reviewed.   ROS: See HPI.  Otherwise, noncontributory.  GEN: nad, alert and oriented HEENT: mucous membranes moist, tm w/o erythema, nasal exam w/o erythema, clear discharge noted,  OP with cobblestoning, frontal sinus slightly ttp B, max sinus not ttp B NECK: supple w/o LA CV: rrr.   PULM: ctab, no inc wob EXT: no edema

## 2014-09-25 ENCOUNTER — Telehealth: Payer: Self-pay | Admitting: *Deleted

## 2014-09-25 NOTE — Telephone Encounter (Signed)
Left message on machine to ask patient to return call to make sure her mammogram is scheduled. 

## 2015-05-13 LAB — HM MAMMOGRAPHY: HM Mammogram: NORMAL (ref 0–4)

## 2015-05-13 LAB — HM PAP SMEAR: HM Pap smear: NORMAL

## 2015-07-09 ENCOUNTER — Encounter: Payer: Self-pay | Admitting: Family Medicine

## 2015-07-09 ENCOUNTER — Ambulatory Visit (INDEPENDENT_AMBULATORY_CARE_PROVIDER_SITE_OTHER): Payer: BLUE CROSS/BLUE SHIELD | Admitting: Family Medicine

## 2015-07-09 VITALS — BP 120/80 | HR 90 | Temp 98.3°F | Ht 64.0 in | Wt 134.8 lb

## 2015-07-09 DIAGNOSIS — J029 Acute pharyngitis, unspecified: Secondary | ICD-10-CM | POA: Diagnosis not present

## 2015-07-09 LAB — POCT RAPID STREP A (OFFICE): RAPID STREP A SCREEN: NEGATIVE

## 2015-07-09 NOTE — Patient Instructions (Signed)
Please use AFRIN nasal spray twice daily for 5 days.  Salt water gargles, throat lozenges and aleve or tylenol per instructions can help with the sore throat.  We have ordered labs or studies at this visit. It can take up to 1-2 weeks for results and processing. We will contact you with instructions IF your results are abnormal. Normal results will be [facility]. If you have not heard from us or can not find your results in [facility] in 2 weeks please contact our office.  Please follow up with your doctor if symptoms worsen or persist.

## 2015-07-09 NOTE — Progress Notes (Signed)
HPI:  Yvonne Hodge is a pleasant 55 year old here for an acute visit for sore throat. She reports she has had drainage and a sore throat for about 5-7 days. She denies fevers, dysphasia, sinus pain or significant sinus congestion, malaise, nausea, vomiting, diarrhea or body aches. She does have a young granddaughter who was recently treated for a "throat infection", but they told her it was not strep throat.  ROS: See pertinent positives and negatives per HPI.  Past Medical History  Diagnosis Date  . Disorder of bone and cartilage, unspecified   . Thyroid disease     not on meds since Dec.2013    Past Surgical History  Procedure Laterality Date  . Cesarean section  1998  . Tubal ligation  1998    during C-section    Family History  Problem Relation Age of Onset  . Cancer Mother     breast cancer  . Hypothyroidism Mother   . Alzheimer's disease Mother   . Hypertension Father   . Hypothyroidism Sister   . Colon cancer Neg Hx   . Heart disease Neg Hx   . Cancer Maternal Aunt     breast cancer    Social History   Social History  . Marital Status: Married    Spouse Name: N/A  . Number of Children: 2  . Years of Education: N/A   Occupational History  . [employer]or    Social History Main Topics  . Smoking status: Never Smoker   . Smokeless tobacco: Never Used  . Alcohol Use: No  . Drug Use: No  . Sexual Activity: Not Asked   Other Topics Concern  . None   Social History Narrative    No current outpatient prescriptions on file.  EXAM:  Filed Vitals:   07/09/15 1112  BP: 120/80  Pulse: 90  Temp: 98.3 F (36.8 C)    Body mass index is 23.13 kg/(m^2).  GENERAL: vitals reviewed and listed above, alert, oriented, appears well hydrated and in no acute distress  HEENT: atraumatic, conjunttiva clear, no obvious abnormalities on inspection of external nose and ears, normal appearance of ear canals and TMs, clear nasal congestion,  mild post oropharyngeal erythema with PND, no tonsillar edema or exudate, no sinus TTP  NECK: no obvious masses on inspection  LUNGS: clear to auscultation bilaterally, no wheezes, rales or rhonchi, good air movement  CV: HRRR, no peripheral edema  MS: moves all extremities without noticeable abnormality  PSYCH: pleasant and cooperative, no obvious depression or anxiety  ASSESSMENT AND PLAN:  Discussed the following assessment and plan:  Sore throat - Plan: POC Rapid Strep A  - We discussed potential etiologies, with VURI being most likely given exam findings. We will check a rapid strep and strep culture and treat accordingly. Otherwise, would advise salt water gargles, Tylenol or Aleve as needed, and a short course of nasal decongestant. We discussed treatment side effects, likely course, antibiotic misuse, transmission, and signs of developing a serious illness. -of course, we advised to return or notify a doctor immediately if symptoms worsen or persist or new concerns arise.    Patient Instructions  Please use AFRIN nasal spray twice daily for 5 days.  Salt water gargles, throat lozenges and aleve or tylenol per instructions can help with the sore throat.  We have ordered labs or studies at this visit. It can take up to 1-2 weeks for results and processing. We will contact you with instructions IF your results are  abnormal. Normal results will be [facility]. If you have not heard from us or can not find your results in [facility] in 2 weeks please contact our office.  Please follow up with your doctor if symptoms worsen or persist.          Janean Eischen R.

## 2015-07-09 NOTE — Progress Notes (Signed)
Pre visit review using our clinic review tool, if applicable. No additional management support is needed unless otherwise documented below in the visit note. 

## 2015-11-16 ENCOUNTER — Encounter: Payer: BLUE CROSS/BLUE SHIELD | Admitting: Internal Medicine

## 2015-11-27 ENCOUNTER — Encounter: Payer: Self-pay | Admitting: Internal Medicine

## 2015-11-27 ENCOUNTER — Ambulatory Visit (INDEPENDENT_AMBULATORY_CARE_PROVIDER_SITE_OTHER): Payer: BLUE CROSS/BLUE SHIELD | Admitting: Internal Medicine

## 2015-11-27 VITALS — BP 100/72 | HR 74 | Temp 98.3°F | Ht 63.5 in | Wt 132.5 lb

## 2015-11-27 DIAGNOSIS — E039 Hypothyroidism, unspecified: Secondary | ICD-10-CM | POA: Diagnosis not present

## 2015-11-27 DIAGNOSIS — Z0000 Encounter for general adult medical examination without abnormal findings: Secondary | ICD-10-CM | POA: Diagnosis not present

## 2015-11-27 LAB — CBC WITH DIFFERENTIAL/PLATELET
BASOS ABS: 0 10*3/uL (ref 0.0–0.1)
Basophils Relative: 0.5 % (ref 0.0–3.0)
EOS ABS: 0.2 10*3/uL (ref 0.0–0.7)
Eosinophils Relative: 3.4 % (ref 0.0–5.0)
HCT: 38.1 % (ref 36.0–46.0)
Hemoglobin: 12.9 g/dL (ref 12.0–15.0)
LYMPHS ABS: 1.5 10*3/uL (ref 0.7–4.0)
Lymphocytes Relative: 22 % (ref 12.0–46.0)
MCHC: 33.8 g/dL (ref 30.0–36.0)
MCV: 87.2 fl (ref 78.0–100.0)
MONO ABS: 0.6 10*3/uL (ref 0.1–1.0)
Monocytes Relative: 8.5 % (ref 3.0–12.0)
NEUTROS ABS: 4.3 10*3/uL (ref 1.4–7.7)
NEUTROS PCT: 65.6 % (ref 43.0–77.0)
PLATELETS: 239 10*3/uL (ref 150.0–400.0)
RBC: 4.37 Mil/uL (ref 3.87–5.11)
RDW: 14.4 % (ref 11.5–15.5)
WBC: 6.6 10*3/uL (ref 4.0–10.5)

## 2015-11-27 LAB — T4, FREE: Free T4: 0.49 ng/dL — ABNORMAL LOW (ref 0.60–1.60)

## 2015-11-27 LAB — COMPREHENSIVE METABOLIC PANEL
ALT: 18 U/L (ref 0–35)
AST: 24 U/L (ref 0–37)
Albumin: 4.1 g/dL (ref 3.5–5.2)
Alkaline Phosphatase: 86 U/L (ref 39–117)
BILIRUBIN TOTAL: 0.3 mg/dL (ref 0.2–1.2)
BUN: 14 mg/dL (ref 6–23)
CALCIUM: 9.7 mg/dL (ref 8.4–10.5)
CO2: 29 meq/L (ref 19–32)
CREATININE: 0.81 mg/dL (ref 0.40–1.20)
Chloride: 106 mEq/L (ref 96–112)
GFR: 77.91 mL/min (ref >60.00)
GLUCOSE: 92 mg/dL (ref 70–99)
Potassium: 4.3 mEq/L (ref 3.5–5.1)
Sodium: 142 mEq/L (ref 135–145)
Total Protein: 7.3 g/dL (ref 6.0–8.3)

## 2015-11-27 LAB — TSH: TSH: 6.01 u[IU]/mL — AB (ref 0.35–4.50)

## 2015-11-27 NOTE — Assessment & Plan Note (Signed)
Colon due 2024 UTD with gyn/mammo Discussed exercise One a day vitamin for calcium and vitamin D Yearly flu vaccine

## 2015-11-27 NOTE — Progress Notes (Signed)
Subjective:    Patient ID: Yvonne Hodge, female    DOB: 13-Aug-1960, 55 y.o.   MRN: [id number]  HPI Here for physical Doing well in general  Does have mild cold now----wouldn't come in for this  Occasional hip pain-- mostly with prolonged standing May be better since shoes with memory foam Busy with 63 month old granddaughter Tries to walk some  Still sees Dr Lauralyn Primes up with yearly mammograms Last pap in January Gets flu shots at work  No current outpatient prescriptions on file prior to visit.   No current facility-administered medications on file prior to visit.     Allergies  Allergen Reactions  . Sulfonamide Derivatives Rash    Past Medical History:  Diagnosis Date  . Disorder of bone and cartilage, unspecified   . Thyroid disease    not on meds since Dec.2013    Past Surgical History:  Procedure Laterality Date  . CESAREAN SECTION  1998  . TUBAL LIGATION  1998   during C-section    Family History  Problem Relation Age of Onset  . Cancer Mother     breast cancer  . Hypothyroidism Mother   . Alzheimer's disease Mother   . Hypertension Father   . Heart disease Father     atrial fib  . Hypothyroidism Sister   . Migraines Sister   . Fibromyalgia Sister   . Cancer Maternal Aunt     breast cancer  . Colon cancer Neg Hx     Social History   Social History  . Marital status: Married    Spouse name: N/A  . Number of children: 2  . Years of education: N/A   Occupational History  . [employer]or    Social History Main Topics  . Smoking status: Never Smoker  . Smokeless tobacco: Never Used  . Alcohol use No  . Drug use: No  . Sexual activity: Not on file   Other Topics Concern  . Not on file   Social History Narrative  . No narrative on file   Review of Systems  Constitutional: Negative for fatigue and unexpected weight change.       Small weight gain in past few months Wears seat belt  HENT: Negative  for tinnitus.        Mild hearing loss Keeps up with dentist  Eyes: Negative for visual disturbance.       No diplopia or unilateral vision loss  Respiratory: Negative for cough, chest tightness and shortness of breath.   Cardiovascular: Negative for chest pain, palpitations and leg swelling.  Gastrointestinal: Negative for abdominal pain, blood in stool, constipation, nausea and vomiting.       No heartburn  Endocrine: Negative for polydipsia and polyuria.  Genitourinary: Positive for dyspareunia. Negative for dysuria and flank pain.  Musculoskeletal: Positive for arthralgias and back pain. Negative for joint swelling.       Pain mostly if on her feet all day  Skin: Negative for rash.       No suspicious lesions  Allergic/Immunologic: Positive for environmental allergies. Negative for immunocompromised state.       Uses OTC meds prn  Neurological: Negative for dizziness, syncope, weakness and light-headedness.       Gets headaches behind right eye at times-- better with afrin for nasal congestion the last time  Hematological: Negative for adenopathy. Does not bruise/bleed easily.  Psychiatric/Behavioral: Negative for dysphoric mood and sleep disturbance. The patient is not nervous/anxious.  Objective:   Physical Exam  Constitutional: She is oriented to person, place, and time. She appears well-developed and well-nourished. No distress.  HENT:  Head: Normocephalic and atraumatic.  Right Ear: External ear normal.  Left Ear: External ear normal.  Mouth/Throat: Oropharynx is clear and moist. No oropharyngeal exudate.  Eyes: Conjunctivae are normal. Pupils are equal, round, and reactive to light.  Neck: Normal range of motion. Neck supple. No thyromegaly present.  Cardiovascular: Normal rate, regular rhythm, normal heart sounds and intact distal pulses.  Exam reveals no gallop.   No murmur heard. Pulmonary/Chest: Effort normal and breath sounds normal. No respiratory distress.  She has no wheezes. She has no rales.  Abdominal: Soft. There is no tenderness.  Musculoskeletal: She exhibits no edema or tenderness.  Lymphadenopathy:    She has no cervical adenopathy.  Neurological: She is alert and oriented to person, place, and time.  Skin: No rash noted. No erythema.  Psychiatric: She has a normal mood and affect. Her behavior is normal.          Assessment & Plan:

## 2015-11-27 NOTE — Assessment & Plan Note (Signed)
Seems euthyroid still without meds Will check labs

## 2015-11-27 NOTE — Progress Notes (Signed)
Pre visit review using our clinic review tool, if applicable. No additional management support is needed unless otherwise documented below in the visit note. 

## 2015-11-28 ENCOUNTER — Other Ambulatory Visit: Payer: Self-pay | Admitting: Internal Medicine

## 2015-11-28 DIAGNOSIS — E039 Hypothyroidism, unspecified: Secondary | ICD-10-CM

## 2015-11-28 MED ORDER — LEVOTHYROXINE SODIUM 25 MCG PO TABS: 25.0000 ug | ORAL_TABLET | Freq: Every day | ORAL | 3 refills | Status: DC

## 2015-12-09 ENCOUNTER — Telehealth: Payer: Self-pay

## 2015-12-09 NOTE — Telephone Encounter (Signed)
Called patient. No answer. Will try later.  

## 2015-12-09 NOTE — Telephone Encounter (Signed)
Spoke to patient. Went over instructions per Dr. Karle StarchLetvak's previous note. Patient verbalized understanding. Scheduled 6 week f/u labs per patient's schedule.

## 2015-12-09 NOTE — Telephone Encounter (Signed)
-----   Message from Karie Schwalbeichard I Letvak, MD sent at 11/28/2015  8:42 AM EDT ----- Results released Starting levothyroxine 25 mcg-- Rx sent Set up repeat blood work in about 6 weeks

## 2016-01-22 ENCOUNTER — Other Ambulatory Visit (INDEPENDENT_AMBULATORY_CARE_PROVIDER_SITE_OTHER): Payer: BLUE CROSS/BLUE SHIELD

## 2016-01-22 DIAGNOSIS — E039 Hypothyroidism, unspecified: Secondary | ICD-10-CM

## 2016-01-22 LAB — TSH: TSH: 5.41 u[IU]/mL — ABNORMAL HIGH (ref 0.35–4.50)

## 2016-01-22 LAB — T4, FREE: FREE T4: 0.63 ng/dL (ref 0.60–1.60)

## 2016-01-22 NOTE — Addendum Note (Signed)
Addended by: Baldomero LamyHAVERS, Aquiles Ruffini C on: 01/22/2016 09:11 AM   Modules accepted: Orders

## 2016-02-03 DIAGNOSIS — Z23 Encounter for immunization: Secondary | ICD-10-CM | POA: Diagnosis not present

## 2016-05-03 ENCOUNTER — Encounter: Payer: Self-pay | Admitting: Internal Medicine

## 2016-05-03 ENCOUNTER — Ambulatory Visit (INDEPENDENT_AMBULATORY_CARE_PROVIDER_SITE_OTHER): Payer: BLUE CROSS/BLUE SHIELD | Admitting: Internal Medicine

## 2016-05-03 VITALS — BP 114/80 | HR 84 | Temp 98.5°F | Wt 137.0 lb

## 2016-05-03 DIAGNOSIS — J0110 Acute frontal sinusitis, unspecified: Secondary | ICD-10-CM | POA: Diagnosis not present

## 2016-05-03 MED ORDER — AMOXICILLIN-POT CLAVULANATE 875-125 MG PO TABS: 1.0000 | ORAL_TABLET | Freq: Two times a day (BID) | ORAL | 0 refills | Status: DC

## 2016-05-03 NOTE — Patient Instructions (Signed)

## 2016-05-03 NOTE — Progress Notes (Signed)
HPI  Pt presents to the clinic today with c/o nasal congestion, sore that and cough. This started 1 week ago, She is blowing brown/green, blood tinged mucous out of his nose. He denies difficulty swallowing. The cough is productive of green mucous. She denies fever, chills or body aches. She has tried Sudafed and an antihistamine OTC. She has also tried [employer] Bowder, Afrin and salt water gargles with minimal relief. She has no history of allergies or breathing problems. She has had sick contacts.  Review of Systems     Past Medical History:  Diagnosis Date  . Disorder of bone and cartilage, unspecified   . Thyroid disease    not on meds since Dec.2013    Family History  Problem Relation Age of Onset  . Cancer Mother     breast cancer  . Hypothyroidism Mother   . Alzheimer's disease Mother   . Hypertension Father   . Heart disease Father     atrial fib  . Hypothyroidism Sister   . Migraines Sister   . Fibromyalgia Sister   . Cancer Maternal Aunt     breast cancer  . Colon cancer Neg Hx     Social History   Social History  . Marital status: Married    Spouse name: N/A  . Number of children: 2  . Years of education: N/A   Occupational History  . [employer] purchasing adminstrator    Social History Main Topics  . Smoking status: Never Smoker  . Smokeless tobacco: Never Used  . Alcohol use No  . Drug use: No  . Sexual activity: Not on file   Other Topics Concern  . Not on file   Social History Narrative  . No narrative on file    Allergies  Allergen Reactions  . Sulfonamide Derivatives Rash     Constitutional: Denies headache, fatigue, fever or abrupt weight changes.  HEENT:  Positive    nasal congestion and sore throat. Denies eye redness, ear pain, ringing in the ears, wax buildup, runny nose or bloody nose. Respiratory: Positive cough. Denies difficulty breathing or shortness of breath.  Cardiovascular: Denies chest pain, chest tightness,  palpitations or swelling in the hands or feet.   No other specific complaints in a complete review of systems (except as listed in HPI above).  Objective:   BP 114/80   Pulse 84   Temp 98.5 F (36.9 C) (Oral)   Wt 137 lb (62.1 kg)   LMP 05/26/2014   SpO2 98%   BMI 23.89 kg/m   General: Appears her stated age,  in NAD. HEENT: Head: normal shape and size, frontal sinus tenderness noted; Eyes: sclera white, no icterus, conjunctiva pink; Ears: Tm's pink but intact, normal light reflex; Nose: mucosa boggy and moist, septum midline; Throat/Mouth: + PND. Teeth present, mucosa pink and moist, no exudate noted, no lesions or ulcerations noted.  Neck:  No adenopathy noted.  Cardiovascular: Normal rate and rhythm.  Pulmonary/Chest: Normal effort and positive vesicular breath sounds. No respiratory distress. No wheezes, rales or ronchi noted.       Assessment & Plan:   Acute bacterial sinusitis  Can use a Neti Pot which can be purchased from your local drug store. Flonase 2 sprays each nostril for 3 days and then as needed. eRx for Augmentin BID for 10 days  RTC as needed or if symptoms persist. Nicki ReaperBAITY, Deseray Daponte, NP

## 2016-05-19 DIAGNOSIS — Z01419 Encounter for gynecological examination (general) (routine) without abnormal findings: Secondary | ICD-10-CM | POA: Diagnosis not present

## 2016-05-19 DIAGNOSIS — Z1231 Encounter for screening mammogram for malignant neoplasm of breast: Secondary | ICD-10-CM | POA: Diagnosis not present

## 2016-05-19 DIAGNOSIS — Z6824 Body mass index (BMI) 24.0-24.9, adult: Secondary | ICD-10-CM | POA: Diagnosis not present

## 2016-05-26 ENCOUNTER — Ambulatory Visit (INDEPENDENT_AMBULATORY_CARE_PROVIDER_SITE_OTHER): Payer: BLUE CROSS/BLUE SHIELD | Admitting: Family Medicine

## 2016-05-26 ENCOUNTER — Encounter: Payer: Self-pay | Admitting: Family Medicine

## 2016-05-26 VITALS — BP 124/64 | HR 64 | Temp 97.9°F | Wt 140.8 lb

## 2016-05-26 DIAGNOSIS — H6122 Impacted cerumen, left ear: Secondary | ICD-10-CM

## 2016-05-26 DIAGNOSIS — H6120 Impacted cerumen, unspecified ear: Secondary | ICD-10-CM | POA: Insufficient documentation

## 2016-05-26 NOTE — Patient Instructions (Signed)
Earwax Buildup Your ears make a substance called earwax. It may also be called cerumen. Sometimes, too much earwax builds up in your ear canal. This can cause ear pain and make it harder for you to hear. CAUSES This condition is caused by too much earwax production or buildup. RISK FACTORS The following factors may make you more likely to develop this condition:  Cleaning your ears often with swabs.  Having narrow ear canals.  Having earwax that is overly thick or sticky.  Having eczema.  Being dehydrated. SYMPTOMS Symptoms of this condition include:  Reduced hearing.  Ear drainage.  Ear pain.  Ear itch.  A feeling of fullness in the ear or feeling that the ear is plugged.  Ringing in the ear.  Coughing. DIAGNOSIS Your health care provider can diagnose this condition based on your symptoms and medical history. Your health care provider will also do an ear exam to look inside your ear with a scope (otoscope). You may also have a hearing test. TREATMENT Treatment for this condition includes:  Over-the-counter or prescription ear drops to soften the earwax.  Earwax removal by a health care provider. This may be done:  By flushing the ear with body-temperature water.  With a medical instrument that has a loop at the end (earwax curette).  With a suction device. HOME CARE INSTRUCTIONS  Take over-the-counter and prescription medicines only as told by your health care provider.  Do not put any objects, including an ear swab, into your ear. You can clean the opening of your ear canal with a washcloth.  Drink enough water to keep your urine clear or pale yellow.  If you have frequent earwax buildup or you use hearing aids, consider seeing your health care provider every 6-12 months for routine preventive ear cleanings. Keep all follow-up visits as told by your health care provider. SEEK MEDICAL CARE IF:  You have ear pain.  Your condition does not improve with  treatment.  You have hearing loss.  You have blood, pus, or other fluid coming from your ear. This information is not intended to replace advice given to you by your health care provider. Make sure you discuss any questions you have with your health care provider. Document Released: 05/19/2004 Document Revised: 08/03/2015 Document Reviewed: 11/26/2014 Elsevier Interactive Patient Education  2017 Elsevier Inc.  

## 2016-05-26 NOTE — Assessment & Plan Note (Signed)
Ceruminosis is noted.  Wax is removed by syringing and manual debridement. Instructions for home care to prevent wax buildup are given.  

## 2016-05-26 NOTE — Progress Notes (Signed)
Pre visit review using our clinic review tool, if applicable. No additional management support is needed unless otherwise documented below in the visit note. 

## 2016-05-26 NOTE — Progress Notes (Signed)
SUBJECTIVE:  Yvonne AverVicki Hodge is a 56 y.o. female pt of Dr. Alphonsus SiasLetvak, who complains of left ear pain/pressure x 3 days. No other URI symptoms.  No hearing loss.  No runny nose, cough, nausea or vomiting. No fever.   Current Outpatient Prescriptions on File Prior to Visit  Medication Sig Dispense Refill  . levothyroxine (SYNTHROID, LEVOTHROID) 25 MCG tablet Take 1 tablet (25 mcg total) by mouth daily before breakfast. 90 tablet 3   No current facility-administered medications on file prior to visit.     Allergies  Allergen Reactions  . Sulfonamide Derivatives Rash    Past Medical History:  Diagnosis Date  . Disorder of bone and cartilage, unspecified   . Thyroid disease    not on meds since Dec.2013    Past Surgical History:  Procedure Laterality Date  . CESAREAN SECTION  1998  . TUBAL LIGATION  1998   during C-section    Family History  Problem Relation Age of Onset  . Cancer Mother     breast cancer  . Hypothyroidism Mother   . Alzheimer's disease Mother   . Hypertension Father   . Heart disease Father     atrial fib  . Hypothyroidism Sister   . Migraines Sister   . Fibromyalgia Sister   . Cancer Maternal Aunt     breast cancer  . Colon cancer Neg Hx     Social History   Social History  . Marital status: Married    Spouse name: N/A  . Number of children: 2  . Years of education: N/A   Occupational History  . 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 Smoking status: Never Smoker  . Smokeless tobacco: Never Used  . Alcohol use No  . Drug use: No  . Sexual activity: Not on file   Other Topics Concern  . Not on file   Social History Narrative  . No narrative on file   The PMH, PSH, Social History, Family History, Medications, and allergies have been [facility], and have been updated if relevant.  OBJECTIVE: BP 124/64   Pulse 64   Temp 97.9 F (36.6 C) (Oral)   Wt 140 lb 12 oz (63.8 kg)   LMP 05/26/2014    SpO2 98%   BMI 24.54 kg/m   She appears well, vital signs are as noted. Left cerumen impaction  Throat and pharynx normal.  Neck supple. No adenopathy in the neck. Nose is congested. Sinuses non tender. The chest is clear, without wheezes or rales.

## 2016-07-27 DIAGNOSIS — L298 Other pruritus: Secondary | ICD-10-CM | POA: Diagnosis not present

## 2016-07-27 DIAGNOSIS — D235 Other benign neoplasm of skin of trunk: Secondary | ICD-10-CM | POA: Diagnosis not present

## 2016-07-27 DIAGNOSIS — D225 Melanocytic nevi of trunk: Secondary | ICD-10-CM | POA: Diagnosis not present

## 2016-07-27 DIAGNOSIS — D485 Neoplasm of uncertain behavior of skin: Secondary | ICD-10-CM | POA: Diagnosis not present

## 2016-07-27 DIAGNOSIS — L820 Inflamed seborrheic keratosis: Secondary | ICD-10-CM | POA: Diagnosis not present

## 2016-08-15 ENCOUNTER — Encounter: Payer: Self-pay | Admitting: Internal Medicine

## 2016-08-15 ENCOUNTER — Ambulatory Visit (INDEPENDENT_AMBULATORY_CARE_PROVIDER_SITE_OTHER): Payer: BLUE CROSS/BLUE SHIELD | Admitting: Internal Medicine

## 2016-08-15 VITALS — BP 122/72 | HR 64 | Temp 97.9°F | Wt 143.5 lb

## 2016-08-15 DIAGNOSIS — M25571 Pain in right ankle and joints of right foot: Secondary | ICD-10-CM | POA: Diagnosis not present

## 2016-08-15 DIAGNOSIS — M79671 Pain in right foot: Secondary | ICD-10-CM | POA: Diagnosis not present

## 2016-08-15 MED ORDER — MELOXICAM 15 MG PO TABS: 15.0000 mg | ORAL_TABLET | Freq: Every day | ORAL | 0 refills | Status: DC

## 2016-08-15 NOTE — Progress Notes (Signed)
Subjective:    Patient ID: Yvonne Hodge, female    DOB: 1961/04/17, 56 y.o.   MRN: [id number]  HPI  Pt presents to the clinic today with c/o right ankle and foot pain. This started 3 weeks ago. She describes the pain as achy and throbbing. She reports the pain is worse first thing in the morning. The pain is worse with weight bearing. She denies numbness or tingling in her foot. She denies any injury to the area. She denies recent increase in activity. She has tried elevation and heat with some relief.  Review of Systems      Past Medical History:  Diagnosis Date  . Disorder of bone and cartilage, unspecified   . Thyroid disease    not on meds since Dec.2013    Current Outpatient Prescriptions  Medication Sig Dispense Refill  . levothyroxine (SYNTHROID, LEVOTHROID) 25 MCG tablet Take 1 tablet (25 mcg total) by mouth daily before breakfast. 90 tablet 3   No current facility-administered medications for this visit.     Allergies  Allergen Reactions  . Sulfonamide Derivatives Rash    Family History  Problem Relation Age of Onset  . Cancer Mother     breast cancer  . Hypothyroidism Mother   . Alzheimer's disease Mother   . Hypertension Father   . Heart disease Father     atrial fib  . Hypothyroidism Sister   . Migraines Sister   . Fibromyalgia Sister   . Cancer Maternal Aunt     breast cancer  . Colon cancer Neg Hx     Social History   Social History  . Marital status: Married    Spouse name: N/A  . Number of children: 2  . Years of education: N/A   Occupational History  . [employer]or    Social History Main Topics  . Smoking status: Never Smoker  . Smokeless tobacco: Never Used  . Alcohol use No  . Drug use: No  . Sexual activity: Not on file   Other Topics Concern  . Not on file   Social History Narrative  . No narrative on file     Constitutional: Denies fever, malaise, fatigue, headache or abrupt weight changes.   Musculoskeletal: Pt reports right ankle and foot pain. Denies decrease in range of motion, difficulty with gait, muscle pain or joint swelling.  Neurological: Denies dizziness, difficulty with memory, difficulty with speech or problems with balance and coordination.    No other specific complaints in a complete review of systems (except as listed in HPI above).  Objective:   Physical Exam  BP 122/72   Pulse 64   Temp 97.9 F (36.6 C) (Oral)   Wt 143 lb 8 oz (65.1 kg)   LMP 05/26/2014   SpO2 98%   BMI 25.02 kg/m  Wt Readings from Last 3 Encounters:  08/15/16 143 lb 8 oz (65.1 kg)  05/26/16 140 lb 12 oz (63.8 kg)  05/03/16 137 lb (62.1 kg)    General: Appears her stated age, in NAD. Skin: Warm, dry and intact. No bruising noted. Musculoskeletal: Normal flexion, extension and rotation of the right ankle. Some swelling in front of the lateral malleolus. No pain with palpation of the heel or arch today. She is able to walk on her toes and her heels. Gait steady. Neurological: Alert and oriented. Sensation intact to BLE.  BMET    Component Value Date/Time   NA 142 11/27/2015 0835   K 4.3 11/27/2015  0835   CL 106 11/27/2015 0835   CO2 29 11/27/2015 0835   GLUCOSE 92 11/27/2015 0835   BUN 14 11/27/2015 0835   CREATININE 0.81 11/27/2015 0835   CALCIUM 9.7 11/27/2015 0835    Lipid Panel     Component Value Date/Time   CHOL 165 05/17/2013 1135   TRIG 92.0 05/17/2013 1135   HDL 59.00 05/17/2013 1135   CHOLHDL 3 05/17/2013 1135   VLDL 18.4 05/17/2013 1135   LDLCALC 88 05/17/2013 1135    CBC    Component Value Date/Time   WBC 6.6 11/27/2015 0835   RBC 4.37 11/27/2015 0835   HGB 12.9 11/27/2015 0835   HCT 38.1 11/27/2015 0835   PLT 239.0 11/27/2015 0835   MCV 87.2 11/27/2015 0835   MCHC 33.8 11/27/2015 0835   RDW 14.4 11/27/2015 0835   LYMPHSABS 1.5 11/27/2015 0835   MONOABS 0.6 11/27/2015 0835   EOSABS 0.2 11/27/2015 0835   BASOSABS 0.0 11/27/2015 0835    Hgb  A1C No results found for: HGBA1C          Assessment & Plan:   Right Heel Pain:  Sounds like plantar fasciitis Rehab exercises given eRx for Meloxicam 15 mg PO daily, advised no other NSAID's while taking Meloxicam Follow up with Dr. Patsy Lager if pain persist or worsens  Right Ankle Pain and Swelling:  ? Ligament strain secondary to heel pain eRx for Meloxicam 15 mg PO daily Try ice instead of heat Discussed use of ACE wrap or Neoprene sleeve to help with swelling  RTC as needed or if symptoms persist or worsen Daneli Butkiewicz, NP

## 2016-08-15 NOTE — Patient Instructions (Signed)

## 2016-09-20 ENCOUNTER — Ambulatory Visit: Payer: BLUE CROSS/BLUE SHIELD | Admitting: Sports Medicine

## 2017-01-04 ENCOUNTER — Ambulatory Visit (INDEPENDENT_AMBULATORY_CARE_PROVIDER_SITE_OTHER): Payer: BLUE CROSS/BLUE SHIELD | Admitting: Family Medicine

## 2017-01-04 ENCOUNTER — Encounter: Payer: Self-pay | Admitting: Family Medicine

## 2017-01-04 VITALS — BP 110/70 | HR 71 | Temp 98.3°F | Ht 63.25 in | Wt 145.0 lb

## 2017-01-04 DIAGNOSIS — M722 Plantar fascial fibromatosis: Secondary | ICD-10-CM

## 2017-01-04 NOTE — Progress Notes (Signed)
Dr. Karleen Hampshire T. Damontay Alred, MD, CAQ Sports Medicine Primary [facility] Sports [facility] [address] [place], [address] Phone: [phone] Fax: [phone]  01/04/2017  Patient: Yvonne Hodge, MRN: [id number], DOB: 1961-04-20, 56 y.o.  Primary Physician:  Karie Schwalbe, MD   Chief Complaint  Patient presents with  . Plantar Fasciitis    Right    Subjective:   This 56 y.o. female patient presents with a 5-6 mo long history of heel pain. This is notable for worsening pain first thing in the morning when arising and standing after sitting.   Prior foot or ankle fractures: none Prior operations: none Orthotics or bracing: none Medications: none PT or home rehab: none Night splints: no Ice massage: y Animal nutritionist massage: no  Metatarsal pain: no  The PMH, PSH, Social History, Family History, Medications, and allergies have been reviewed in [facility], and have been updated if relevant.  Patient Active Problem List   Diagnosis Date Noted  . Cerumen impaction 05/26/2016  . Hypothyroidism 01/09/2007  . OSTEOPENIA 01/09/2007    Past Medical History:  Diagnosis Date  . Disorder of bone and cartilage, unspecified   . Thyroid disease    not on meds since Dec.2013    Past Surgical History:  Procedure Laterality Date  . CESAREAN SECTION  1998  . TUBAL LIGATION  1998   during C-section    Social History   Social History  . Marital status: Married    Spouse name: N/A  . Number of children: 2  . Years of education: N/A   Occupational History  . [employer]    Social History Main Topics  . Smoking status: Never Smoker  . Smokeless tobacco: Never Used  . Alcohol use No  . Drug use: No  . Sexual activity: Not on file   Other Topics Concern  . Not on file   Social History Narrative  . No narrative on file    Family History  Problem Relation Age of Onset  . Cancer Mother        breast cancer  . Hypothyroidism Mother   . Alzheimer's  disease Mother   . Hypertension Father   . Heart disease Father        atrial fib  . Hypothyroidism Sister   . Migraines Sister   . Fibromyalgia Sister   . Cancer Maternal Aunt        breast cancer  . Colon cancer Neg Hx     Allergies  Allergen Reactions  . Sulfonamide Derivatives Rash    Medication list reviewed and updated in full in [facility].  GEN: No fevers, chills. Nontoxic. Primarily MSK c/o today. MSK: Detailed in the HPI GI: tolerating PO intake without difficulty Neuro: No numbness, parasthesias, or tingling associated. Otherwise the pertinent positives of the ROS are noted above.   Objective:   Blood pressure 110/70, pulse 71, temperature 98.3 F (36.8 C), temperature source Oral, height 5' 3.25" (1.607 m), weight 145 lb (65.8 kg), last menstrual period 05/26/2014.  GEN: Well-developed,well-nourished,in no acute distress; alert,appropriate and cooperative throughout examination HEENT: Normocephalic and atraumatic without obvious abnormalities. Ears, externally no deformities PULM: Breathing comfortably in no respiratory distress EXT: No clubbing, cyanosis, or edema PSYCH: Normally interactive. Cooperative during the interview. Pleasant. Friendly and conversant. Not anxious or depressed appearing. Normal, full affect.  Echymosis: no Edema: no ROM: full LE B Gait: heel toe, non-antalgic MT pain: no Callus pattern: none Lateral Mall: NT Medial Mall:  NT Talus: NT Navicular: NT Calcaneous: NT Metatarsals: NT 5th MT: NT Phalanges: NT Achilles: NT Plantar Fascia: tender, medial along PF. Pain with forced dorsi Fat Pad: NT Peroneals: NT Post Tib: NT Great Toe: Nml motion Ant Drawer: neg Other foot breakdown: none Long arch: preserved Transverse arch: preserved Hindfoot breakdown: none Sensation: intact  Assessment and Plan:   Plantar fasciitis, right  >25 minutes spent in face to face time with patient, >50% spent in counselling or  coordination of care   Anatomy reviewed. Stretching and rehab are critically important to the treatment of PF. Reviewed footwear. Rigid soles have been shown to help with PF. Arch binders and sports insoles  Reviewed rehab of stretching and calf raises.  Reviewed rehab from [employer]  Could benefit from a corticosteroid injection if conservative treatment fails.  Follow-up: prn   Patient Instructions  Please read handouts on Plantar Fascitis.  STRETCHING and Strengthening program critically important.  Strengthening on foot and calf muscles as seen in handout. Calf raises, 2 legged, then 1 legged. Foot massage with tennis ball. Ice massage.  Towel Scrunches: get a towel or hand towel, use toes to pick up and scrunch up the towel.  Marble pick-ups, practice picking up marbles with toes and placing into a cup  NEEDS TO BE DONE EVERY DAY  Recommended over the counter insoles. (Spenco or Hapad)  A rigid shoe with good arch support helps: Dansko (great), Randel PiggKeen, Merrell No easily bendable shoes.   Tuli's heel cups      Signed,  Camauri Fleece T. Manford Sprong, MD   Patient's Medications  New Prescriptions   No medications on file  Previous Medications   LEVOTHYROXINE (SYNTHROID, LEVOTHROID) 25 MCG TABLET    Take 1 tablet (25 mcg total) by mouth daily before breakfast.   MELOXICAM (MOBIC) 15 MG TABLET    Take 1 tablet (15 mg total) by mouth daily.  Modified Medications   No medications on file  Discontinued Medications   No medications on file

## 2017-01-04 NOTE — Patient Instructions (Signed)

## 2017-02-01 DIAGNOSIS — Z23 Encounter for immunization: Secondary | ICD-10-CM | POA: Diagnosis not present

## 2017-04-13 ENCOUNTER — Ambulatory Visit: Payer: BLUE CROSS/BLUE SHIELD | Admitting: Family Medicine

## 2017-05-25 DIAGNOSIS — Z13820 Encounter for screening for osteoporosis: Secondary | ICD-10-CM | POA: Diagnosis not present

## 2017-05-25 DIAGNOSIS — Z6825 Body mass index (BMI) 25.0-25.9, adult: Secondary | ICD-10-CM | POA: Diagnosis not present

## 2017-05-25 DIAGNOSIS — N958 Other specified menopausal and perimenopausal disorders: Secondary | ICD-10-CM | POA: Diagnosis not present

## 2017-05-25 DIAGNOSIS — Z01419 Encounter for gynecological examination (general) (routine) without abnormal findings: Secondary | ICD-10-CM | POA: Diagnosis not present

## 2017-05-25 DIAGNOSIS — Z1231 Encounter for screening mammogram for malignant neoplasm of breast: Secondary | ICD-10-CM | POA: Diagnosis not present

## 2017-05-25 DIAGNOSIS — M8588 Other specified disorders of bone density and structure, other site: Secondary | ICD-10-CM | POA: Diagnosis not present

## 2017-06-14 DIAGNOSIS — M8580 Other specified disorders of bone density and structure, unspecified site: Secondary | ICD-10-CM | POA: Diagnosis not present

## 2017-11-29 ENCOUNTER — Ambulatory Visit: Payer: BLUE CROSS/BLUE SHIELD | Admitting: Primary Care

## 2017-11-29 VITALS — BP 128/80 | HR 61 | Temp 98.0°F | Ht 63.25 in | Wt 143.0 lb

## 2017-11-29 DIAGNOSIS — R110 Nausea: Secondary | ICD-10-CM | POA: Diagnosis not present

## 2017-11-29 LAB — CBC WITH DIFFERENTIAL/PLATELET
BASOS PCT: 0 % (ref 0.0–3.0)
Basophils Absolute: 0 10*3/uL (ref 0.0–0.1)
EOS ABS: 0 10*3/uL (ref 0.0–0.7)
EOS PCT: 0 % (ref 0.0–5.0)
HCT: 39.4 % (ref 36.0–46.0)
Hemoglobin: 13.4 g/dL (ref 12.0–15.0)
LYMPHS ABS: 1.3 10*3/uL (ref 0.7–4.0)
Lymphocytes Relative: 9.4 % — ABNORMAL LOW (ref 12.0–46.0)
MCHC: 34.1 g/dL (ref 30.0–36.0)
MCV: 89.6 fl (ref 78.0–100.0)
MONO ABS: 0.6 10*3/uL (ref 0.1–1.0)
Monocytes Relative: 4.7 % (ref 3.0–12.0)
NEUTROS ABS: 11.6 10*3/uL — AB (ref 1.4–7.7)
NEUTROS PCT: 85.9 % — AB (ref 43.0–77.0)
Platelets: 289 10*3/uL (ref 150.0–400.0)
RBC: 4.4 Mil/uL (ref 3.87–5.11)
RDW: 13.3 % (ref 11.5–15.5)
WBC: 13.5 10*3/uL — AB (ref 4.0–10.5)

## 2017-11-29 LAB — COMPREHENSIVE METABOLIC PANEL
ALK PHOS: 101 U/L (ref 39–117)
ALT: 28 U/L (ref 0–35)
AST: 22 U/L (ref 0–37)
Albumin: 4.5 g/dL (ref 3.5–5.2)
BUN: 25 mg/dL — AB (ref 6–23)
CO2: 27 mEq/L (ref 19–32)
Calcium: 10.3 mg/dL (ref 8.4–10.5)
Chloride: 101 mEq/L (ref 96–112)
Creatinine, Ser: 0.73 mg/dL (ref 0.40–1.20)
GFR: 87.21 mL/min (ref >60.00)
GLUCOSE: 159 mg/dL — AB (ref 70–99)
POTASSIUM: 4.1 meq/L (ref 3.5–5.1)
SODIUM: 138 meq/L (ref 135–145)
TOTAL PROTEIN: 7.8 g/dL (ref 6.0–8.3)
Total Bilirubin: 0.5 mg/dL (ref 0.2–1.2)

## 2017-11-29 LAB — LIPASE: LIPASE: 21 U/L (ref 11.0–59.0)

## 2017-11-29 MED ORDER — PROMETHAZINE HCL 25 MG/ML IJ SOLN
12.5000 mg | Freq: Once | INTRAMUSCULAR | Status: AC
  Administered 2017-11-29: 12.5 mg via INTRAMUSCULAR

## 2017-11-29 NOTE — Progress Notes (Signed)
Subjective:    Patient ID: Yvonne Hodge, female    DOB: [birth year], 57 y.o.   MRN: [id number]  HPI  Yvonne Hodge is a 57 year old female who presents today with a chief complaint of nausea.   She first noticed her nausea during work yesterday. Her nausea has been persistent since yesterday.   Prior to her symptoms she had a sweet tea and bagel, thinks this may have contributed. She also reports difficulty sleeping and a decrease in appetite. Symptoms are worse with movement, but denies dizziness. She also denies vomiting, abdominal pain, diarrhea, constipation, fevers, chills, urinary symptoms. She was exposed to her granddaughter who was vomiting Friday last week. Her granddaughter quickly recovered.   Her mother passed away from pancreatic cancer and the first symptoms she had was severe nausea. She is concerned this may be the case. She is postmenopausal. She is able to drink without additional nausea but has had little today.  Review of Systems  Constitutional: Positive for fatigue. Negative for fever.  HENT: Negative for rhinorrhea.   Respiratory: Negative for cough.   Gastrointestinal: Positive for nausea. Negative for abdominal pain, constipation, diarrhea and vomiting.  Genitourinary: Negative for dysuria and frequency.  Neurological: Negative for dizziness and headaches.       Past Medical History:  Diagnosis Date  . Disorder of bone and cartilage, unspecified   . Thyroid disease    not on meds since Dec.2013     Social History   Socioeconomic History  . Marital status: Married    Spouse name: Not on file  . Number of children: 2  . Years of education: Not on file  . Highest education level: Not on file  Occupational History  . 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 Financial resource strain: Not on file  . Food insecurity:    Worry: Not on file    Inability: Not on file  . Transportation needs:    Medical: Not on file   Non-medical: Not on file  Tobacco Use  . Smoking status: Never Smoker  . Smokeless tobacco: Never Used  Substance and Sexual Activity  . Alcohol use: No  . Drug use: No  . Sexual activity: Not on file  Lifestyle  . Physical activity:    Days per week: Not on file    Minutes per session: Not on file  . Stress: Not on file  Relationships  . Social connections:    Talks on phone: Not on file    Gets together: Not on file    Attends religious service: Not on file    Active member of club or organization: Not on file    Attends meetings of clubs or organizations: Not on file    Relationship status: Not on file  . Intimate partner violence:    Fear of current or ex partner: Not on file    Emotionally abused: Not on file    Physically abused: Not on file    Forced sexual activity: Not on file  Other Topics Concern  . Not on file  Social History Narrative  . Not on file    Past Surgical History:  Procedure Laterality Date  . CESAREAN SECTION  1998  . TUBAL LIGATION  1998   during C-section    Family History  Problem Relation Age of Onset  . Cancer Mother        breast cancer  . Hypothyroidism Mother   . Alzheimer's disease Mother   .  Hypertension Father   . Heart disease Father        atrial fib  . Hypothyroidism Sister   . Migraines Sister   . Fibromyalgia Sister   . Cancer Maternal Aunt        breast cancer  . Colon cancer Neg Hx     Allergies  Allergen Reactions  . Sulfonamide Derivatives Rash    Current Outpatient Medications on File Prior to Visit  Medication Sig Dispense Refill  . levothyroxine (SYNTHROID, LEVOTHROID) 25 MCG tablet Take 1 tablet (25 mcg total) by mouth daily before breakfast. (Patient not taking: Reported on 11/29/2017) 90 tablet 3  . meloxicam (MOBIC) 15 MG tablet Take 1 tablet (15 mg total) by mouth daily. (Patient not taking: Reported on 11/29/2017) 30 tablet 0   No current facility-administered medications on file prior to visit.      BP 128/80   Pulse 61   Temp 98 F (36.7 C) (Oral)   Ht 5' 3.25" (1.607 m)   Wt 143 lb (64.9 kg)   LMP 05/26/2014   SpO2 98%   BMI 25.13 kg/m    Objective:   Physical Exam  Constitutional: She appears well-nourished.  Neck: Neck supple.  Cardiovascular: Normal rate and regular rhythm.  Respiratory: Effort normal and breath sounds normal.  GI: Soft. Bowel sounds are normal. There is no tenderness.  Skin: Skin is warm and dry.  Psychiatric: She has a normal mood and affect.           Assessment & Plan:

## 2017-11-29 NOTE — Addendum Note (Signed)
Addended by: Tawnya CrookSAMBATH, Tersa Fotopoulos on: 11/29/2017 02:26 PM   Modules accepted: Orders

## 2017-11-29 NOTE — Assessment & Plan Note (Signed)
Present for the last 24 hours, no other symptoms. Exam benign, she does appear tired.  Check labs today including CBC, CMP, Lipase (patient very concerned about pancreatic cancer). Discussed importance of proper hydration with water or low sugar Gatorade.  IM Phenergan provided today, she lives 5 minutes away and is aware that this will cause drowsiness.  She will update if symptoms progress.

## 2017-11-29 NOTE — Patient Instructions (Signed)
Stop by the lab prior to leaving today. I will notify you of your results once received.   Please notify me if you develop abdominal pain, diarrhea, fevers, vomiting.   Make sure to drink water throughout the day to prevent dehydration.   It was a pleasure meeting you!

## 2017-11-30 ENCOUNTER — Telehealth: Payer: Self-pay | Admitting: Primary Care

## 2017-11-30 ENCOUNTER — Other Ambulatory Visit: Payer: Self-pay | Admitting: Primary Care

## 2017-11-30 ENCOUNTER — Ambulatory Visit
Admission: RE | Admit: 2017-11-30 | Discharge: 2017-11-30 | Disposition: A | Discharge status: Home / Self Care | Payer: BLUE CROSS/BLUE SHIELD | Source: Ambulatory Visit | Attending: Primary Care | Admitting: Primary Care

## 2017-11-30 DIAGNOSIS — R110 Nausea: Secondary | ICD-10-CM

## 2017-11-30 DIAGNOSIS — N289 Disorder of kidney and ureter, unspecified: Secondary | ICD-10-CM | POA: Insufficient documentation

## 2017-11-30 DIAGNOSIS — R933 Abnormal findings on diagnostic imaging of other parts of digestive tract: Secondary | ICD-10-CM | POA: Diagnosis not present

## 2017-11-30 DIAGNOSIS — K8689 Other specified diseases of pancreas: Secondary | ICD-10-CM

## 2017-11-30 DIAGNOSIS — R1907 Generalized intra-abdominal and pelvic swelling, mass and lump: Secondary | ICD-10-CM

## 2017-11-30 MED ORDER — PROMETHAZINE HCL 12.5 MG PO TABS: 12.5000 mg | ORAL_TABLET | Freq: Three times a day (TID) | ORAL | 0 refills | Status: DC | PRN

## 2017-11-30 NOTE — Telephone Encounter (Signed)
Copied from CRM [id number]. Topic: General - Other >> Nov 30, 2017  8:29 AM Yvonne Hodge, Yvonne Hodge wrote: Reason for CRM: pt called and stated that she had an appointment with clark 11/29/17 for nausea and states that she was still sick all night. Pt would like something called in. Please advise

## 2017-11-30 NOTE — Telephone Encounter (Signed)
Pt called back; pt advised as instructed and voiced understanding. Pt is willing to get US of abdomen. Pt said everything is the same; pt cannot keep anything down except some liquid, pt said she feels like something is stuck in her throat; no swelling in throat and no difficulty breathing. No fever. Pt will pick up phenergan from pharmacy and [facility]. Also see lab result note. FYI to Allayne GitelmanK Clark NP and Dr Alphonsus SiasLetvak.

## 2017-11-30 NOTE — Telephone Encounter (Signed)
Pt said on 11/28/17 she started with the nausea in the AM and later in early afternoon started with more of a spit up rather than vomiting when she would try to take food or drink; pt can keep some of the drink down if pt does not incorporate food with it. Pt does not have diarrhea.

## 2017-11-30 NOTE — Telephone Encounter (Signed)
Noted. US orders placed. Rena, can you clarify, does this mean she's now vomiting or experiencing diarrhea? You mentioned that she "cannot keep anything down except some liquid". Yesterday she had no other symptoms except for nausea.

## 2017-11-30 NOTE — Addendum Note (Signed)
Addended by: Doreene NestLARK, KATHERINE K on: 11/30/2017 02:40 PM   Modules accepted: Orders

## 2017-11-30 NOTE — Telephone Encounter (Signed)
Please notify patient that I spoke with Dr. Alphonsus SiasLetvak regarding her visit and symptoms. We would like to get an ultrasound of her abdomen for further evaluation.  Does she have any new symptoms or just nausea?  I sent in a prescription for Phenergan 12.5 mg tablets to her pharmacy to use as needed. This may cause drowsiness.   Is she agreeable to the abdominal ultrasound?

## 2017-12-01 ENCOUNTER — Other Ambulatory Visit: Payer: Self-pay | Admitting: Primary Care

## 2017-12-01 ENCOUNTER — Ambulatory Visit
Admission: RE | Admit: 2017-12-01 | Discharge: 2017-12-01 | Disposition: A | Discharge status: Home / Self Care | Payer: BLUE CROSS/BLUE SHIELD | Source: Ambulatory Visit | Attending: Primary Care | Admitting: Primary Care

## 2017-12-01 ENCOUNTER — Telehealth: Payer: Self-pay | Admitting: Internal Medicine

## 2017-12-01 ENCOUNTER — Ambulatory Visit: Payer: Self-pay | Admitting: *Deleted

## 2017-12-01 DIAGNOSIS — R1907 Generalized intra-abdominal and pelvic swelling, mass and lump: Secondary | ICD-10-CM

## 2017-12-01 DIAGNOSIS — K869 Disease of pancreas, unspecified: Secondary | ICD-10-CM | POA: Diagnosis not present

## 2017-12-01 DIAGNOSIS — K8689 Other specified diseases of pancreas: Secondary | ICD-10-CM

## 2017-12-01 MED ORDER — IOHEXOL 300 MG/ML  SOLN
85.0000 mL | Freq: Once | INTRAMUSCULAR | Status: AC | PRN
  Administered 2017-12-01: 85 mL via INTRAVENOUS

## 2017-12-01 NOTE — Telephone Encounter (Signed)
Noted---agree with urgent imaging given the recurrent severe pain

## 2017-12-01 NOTE — Telephone Encounter (Signed)
Noted, await CT scan results as this could be an infectious process.

## 2017-12-01 NOTE — Telephone Encounter (Signed)
I spoke with pt. Pt is having dull abd pain above the belly button; pain does not radiate anywhere. Pt has been up since 2 AM; pain level now is 6.pt said walking makes it feels better instead of sitting, nausea is better since taking the promethazine. No fever, no vomiting but still small amt of spit up or gagging but nothing comes out. No diarrhea. Pt has appt 12/01/17 at 1:30 for CT of abd w/wo contrast.[facility] [place]. Will send note to Allayne GitelmanK Clark NP and Dr Alphonsus SiasLetvak.

## 2017-12-01 NOTE — Telephone Encounter (Signed)
Spoke with patient regarding results. She will update early next week if no improvement.

## 2017-12-01 NOTE — Telephone Encounter (Signed)
Mayra ReelKate Clark NP said to take tylenol or Ibuprofen for pain until get report on CT of abd. Pt voiced understanding.  Pt will cb prior to CT at 1:30 if condition worsens. FYI to Allayne GitelmanK Clark NP.

## 2017-12-01 NOTE — Telephone Encounter (Signed)
Copied from CRM [id number]. Topic: Quick Communication - See Telephone Encounter >> Dec 01, 2017  4:10 PM Arlyss Gandyichardson, Jahmeer Porche N, NT wrote: CRM for notification. See Telephone encounter for: 12/01/17. Pt requesting a call with her CT result.

## 2017-12-01 NOTE — Telephone Encounter (Signed)
The results of CT are in Epic and copy taken to Allayne GitelmanK Clark NP area.

## 2017-12-01 NOTE — Telephone Encounter (Signed)
Copied from CRM [id number]. Topic: Quick Communication - See Telephone Encounter >> Dec 01, 2017 10:20 AM Tamela OddiMartin, Don'Quashia, NT wrote: CRM for notification. See Telephone encounter for: 12/01/17. Patient called and states she would like for Jae DireKate to call her in something for pain. She states she his having pain in her abd.  [id number] [place] [place] - [address] [phone] (Phone) [phone] (Fax)

## 2017-12-01 NOTE — Telephone Encounter (Signed)
Micael HampshireStephanie Mogg from [facility] called with the following results: 1.)normal pancreas, and 2.)normal liver and biliary tree; read by Dr Ty HiltsEdmonds; this CT was ordered by Vernona RiegerKatherine Clark;  will route to office for notification of this encounter.  Reason for Disposition . [1] Other NON-URGENT information for PCP AND [2] does not require PCP response  Answer Assessment - Initial Assessment Questions 1. REASON FOR CALL or QUESTION: "What is your reason for calling today?" or "How can I best help you?" or "What question do you have that I can help answer?"     Imaging report 2. CALLER: Document the source of call. (e.g., laboratory, patient).     Micael HampshireStephanie Mogg from [facility]  Protocols used: PCP CALL - NO TRIAGE-A-AH

## 2017-12-02 NOTE — Telephone Encounter (Signed)
Yvonne ReelKate Clark discussed the results with her

## 2017-12-07 ENCOUNTER — Telehealth: Payer: Self-pay | Admitting: Internal Medicine

## 2017-12-07 NOTE — Telephone Encounter (Signed)
Pt dropped off fmla paperwork She stated her first day of was 11/28/17 and she is going to try to go back to work Monday 12/11/17.  She also need a work note stating she can go back to work on 12/11/17  Paperwork in [employer] in box

## 2017-12-07 NOTE — Telephone Encounter (Signed)
Completed and placed on Robins desk. 

## 2017-12-08 ENCOUNTER — Encounter: Payer: Self-pay | Admitting: Primary Care

## 2017-12-08 NOTE — Telephone Encounter (Signed)
Completed and handed to Robin. °

## 2017-12-08 NOTE — Telephone Encounter (Signed)
Pt aware paperwork is ready Copy for pt Copy for scan Mailed copy in self addressed envelope

## 2017-12-08 NOTE — Telephone Encounter (Signed)
Pt also needs letter stating she can go back to work 12/11/17

## 2018-02-02 DIAGNOSIS — Z23 Encounter for immunization: Secondary | ICD-10-CM | POA: Diagnosis not present

## 2018-07-05 DIAGNOSIS — Z6825 Body mass index (BMI) 25.0-25.9, adult: Secondary | ICD-10-CM | POA: Diagnosis not present

## 2018-07-05 DIAGNOSIS — E559 Vitamin D deficiency, unspecified: Secondary | ICD-10-CM | POA: Diagnosis not present

## 2018-07-05 DIAGNOSIS — Z01419 Encounter for gynecological examination (general) (routine) without abnormal findings: Secondary | ICD-10-CM | POA: Diagnosis not present

## 2018-07-05 DIAGNOSIS — Z1231 Encounter for screening mammogram for malignant neoplasm of breast: Secondary | ICD-10-CM | POA: Diagnosis not present

## 2018-07-12 DIAGNOSIS — D2261 Melanocytic nevi of right upper limb, including shoulder: Secondary | ICD-10-CM | POA: Diagnosis not present

## 2018-07-12 DIAGNOSIS — L570 Actinic keratosis: Secondary | ICD-10-CM | POA: Diagnosis not present

## 2018-07-12 DIAGNOSIS — L821 Other seborrheic keratosis: Secondary | ICD-10-CM | POA: Diagnosis not present

## 2018-07-12 DIAGNOSIS — D225 Melanocytic nevi of trunk: Secondary | ICD-10-CM | POA: Diagnosis not present

## 2018-07-12 DIAGNOSIS — D1801 Hemangioma of skin and subcutaneous tissue: Secondary | ICD-10-CM | POA: Diagnosis not present

## 2018-10-22 ENCOUNTER — Other Ambulatory Visit: Payer: Self-pay

## 2018-10-22 ENCOUNTER — Ambulatory Visit (INDEPENDENT_AMBULATORY_CARE_PROVIDER_SITE_OTHER): Payer: BC Managed Care – PPO | Admitting: Internal Medicine

## 2018-10-22 ENCOUNTER — Encounter: Payer: Self-pay | Admitting: Internal Medicine

## 2018-10-22 VITALS — BP 122/84 | HR 72 | Temp 98.1°F | Ht 62.75 in | Wt 138.0 lb

## 2018-10-22 DIAGNOSIS — Z0000 Encounter for general adult medical examination without abnormal findings: Secondary | ICD-10-CM | POA: Insufficient documentation

## 2018-10-22 DIAGNOSIS — E039 Hypothyroidism, unspecified: Secondary | ICD-10-CM

## 2018-10-22 MED ORDER — LEVOTHYROXINE SODIUM 25 MCG PO TABS: 25.0000 ug | ORAL_TABLET | Freq: Every day | ORAL | 3 refills | Status: DC

## 2018-10-22 NOTE — Assessment & Plan Note (Signed)
Healthy Discussed fitness Mammogram done by gyn in March Pap may be due next year Colon due 2024 Flu vaccine in the fall

## 2018-10-22 NOTE — Progress Notes (Signed)
Subjective:    Patient ID: Yvonne Hodge, female    DOB: 06/01/1960, 58 y.o.   MRN: [id number]  HPI Here for physical Did have the 7-10 days of nausea and decreased appetite last summer---resolved and no recurrence Working from home since March--- same job  Will occasionally get some hip aching No set exercise--but watches grandkids Walks dog at times  Current Outpatient Medications on File Prior to Visit  Medication Sig Dispense Refill  . levothyroxine (SYNTHROID, LEVOTHROID) 25 MCG tablet Take 1 tablet (25 mcg total) by mouth daily before breakfast. 90 tablet 3   No current facility-administered medications on file prior to visit.     Allergies  Allergen Reactions  . Sulfonamide Derivatives Rash    Past Medical History:  Diagnosis Date  . Disorder of bone and cartilage, unspecified   . Thyroid disease    not on meds since Dec.2013    Past Surgical History:  Procedure Laterality Date  . CESAREAN SECTION  1998  . TUBAL LIGATION  1998   during C-section    Family History  Problem Relation Age of Onset  . Cancer Mother        breast cancer  . Hypothyroidism Mother   . Alzheimer's disease Mother   . Hypertension Father   . Heart disease Father        atrial fib  . Hypothyroidism Sister   . Migraines Sister   . Fibromyalgia Sister   . Cancer Maternal Aunt        breast cancer  . Colon cancer Neg Hx     Social History   Socioeconomic History  . Marital status: Married    Spouse name: Not on file  . Number of children: 2  . Years of education: Not on file  . Highest education level: Not on file  Occupational History  . Occupation: Immunologist  Social Needs  . Financial resource strain: Not on file  . Food insecurity    Worry: Not on file    Inability: Not on file  . Transportation needs    Medical: Not on file    Non-medical: Not on file  Tobacco Use  . Smoking status: Never Smoker  . Smokeless tobacco: Never Used   Substance and Sexual Activity  . Alcohol use: No  . Drug use: No  . Sexual activity: Not on file  Lifestyle  . Physical activity    Days per week: Not on file    Minutes per session: Not on file  . Stress: Not on file  Relationships  . Social Herbalist on phone: Not on file    Gets together: Not on file    Attends religious service: Not on file    Active member of club or organization: Not on file    Attends meetings of clubs or organizations: Not on file    Relationship status: Not on file  . Intimate partner violence    Fear of current or ex partner: Not on file    Emotionally abused: Not on file    Physically abused: Not on file    Forced sexual activity: Not on file  Other Topics Concern  . Not on file  Social History Narrative  . Not on file   Review of Systems  Constitutional: Negative for fatigue and unexpected weight change.       Wears seat belt  HENT: Negative for hearing loss and tinnitus.  Recent dental work---has temporary crown in  Eyes: Negative for visual disturbance.       No diplopia or unilateral vision loss  Respiratory: Negative for cough, chest tightness and shortness of breath.   Cardiovascular: Negative for chest pain, palpitations and leg swelling.  Gastrointestinal: Negative for blood in stool and constipation.       Occasional heartburn---tums works  Endocrine: Negative for polydipsia and polyuria.  Genitourinary: Negative for dysuria and hematuria.       Decreased libido Uses lubricant  Musculoskeletal: Negative for arthralgias, back pain and joint swelling.  Skin: Negative for rash.  Allergic/Immunologic: Positive for environmental allergies. Negative for immunocompromised state.  Neurological: Negative for dizziness, syncope, light-headedness and headaches.       Occ headaches--if she sleeps wrong  Hematological: Negative for adenopathy. Does not bruise/bleed easily.  Psychiatric/Behavioral: Negative for dysphoric mood  and sleep disturbance. The patient is not nervous/anxious.        Objective:   Physical Exam  Constitutional: She is oriented to person, place, and time. She appears well-developed. No distress.  HENT:  Head: Normocephalic and atraumatic.  Right Ear: External ear normal.  Left Ear: External ear normal.  Mouth/Throat: Oropharynx is clear and moist. No oropharyngeal exudate.  Eyes: Pupils are equal, round, and reactive to light. Conjunctivae are normal.  Neck: No thyromegaly present.  Cardiovascular: Normal rate, regular rhythm, normal heart sounds and intact distal pulses. Exam reveals no gallop.  No murmur heard. Respiratory: Effort normal and breath sounds normal. No respiratory distress. She has no wheezes. She has no rales.  GI: Soft. There is no abdominal tenderness.  Musculoskeletal:        General: No tenderness or edema.  Lymphadenopathy:    She has no cervical adenopathy.  Neurological: She is alert and oriented to person, place, and time.  Skin: No rash noted. No erythema.  Psychiatric: She has a normal mood and affect. Her behavior is normal.           Assessment & Plan:

## 2018-10-22 NOTE — Assessment & Plan Note (Signed)
Seems euthyroid ?Will check labs ?

## 2018-10-23 LAB — COMPREHENSIVE METABOLIC PANEL
ALT: 44 U/L — ABNORMAL HIGH (ref 0–35)
AST: 39 U/L — ABNORMAL HIGH (ref 0–37)
Albumin: 4.1 g/dL (ref 3.5–5.2)
Alkaline Phosphatase: 123 U/L — ABNORMAL HIGH (ref 39–117)
BUN: 16 mg/dL (ref 6–23)
CO2: 28 mEq/L (ref 19–32)
Calcium: 8.8 mg/dL (ref 8.4–10.5)
Chloride: 106 mEq/L (ref 96–112)
Creatinine, Ser: 0.72 mg/dL (ref 0.40–1.20)
GFR: 83.11 mL/min (ref >60.00)
Glucose, Bld: 99 mg/dL (ref 70–99)
Potassium: 3.8 mEq/L (ref 3.5–5.1)
Sodium: 141 mEq/L (ref 135–145)
Total Bilirubin: 0.3 mg/dL (ref 0.2–1.2)
Total Protein: 6.9 g/dL (ref 6.0–8.3)

## 2018-10-23 LAB — CBC
HCT: 39.2 % (ref 36.0–46.0)
Hemoglobin: 13 g/dL (ref 12.0–15.0)
MCHC: 33.3 g/dL (ref 30.0–36.0)
MCV: 91.5 fl (ref 78.0–100.0)
Platelets: 245 10*3/uL (ref 150.0–400.0)
RBC: 4.29 Mil/uL (ref 3.87–5.11)
RDW: 13.3 % (ref 11.5–15.5)
WBC: 5.4 10*3/uL (ref 4.0–10.5)

## 2018-10-23 LAB — LIPID PANEL
Cholesterol: 171 mg/dL (ref 0–200)
HDL: 61.8 mg/dL (ref >39.00)
LDL Cholesterol: 97 mg/dL (ref 0–99)
NonHDL: 109.15
Total CHOL/HDL Ratio: 3
Triglycerides: 62 mg/dL (ref 0.0–149.0)
VLDL: 12.4 mg/dL (ref 0.0–40.0)

## 2018-10-23 LAB — TSH: TSH: 6.2 u[IU]/mL — ABNORMAL HIGH (ref 0.35–4.50)

## 2018-10-23 LAB — T4, FREE: Free T4: 0.51 ng/dL — ABNORMAL LOW (ref 0.60–1.60)

## 2018-10-24 ENCOUNTER — Other Ambulatory Visit: Payer: Self-pay | Admitting: Internal Medicine

## 2018-10-24 DIAGNOSIS — E039 Hypothyroidism, unspecified: Secondary | ICD-10-CM

## 2018-12-28 ENCOUNTER — Other Ambulatory Visit (INDEPENDENT_AMBULATORY_CARE_PROVIDER_SITE_OTHER): Payer: BC Managed Care – PPO

## 2018-12-28 ENCOUNTER — Other Ambulatory Visit: Payer: Self-pay

## 2018-12-28 DIAGNOSIS — E039 Hypothyroidism, unspecified: Secondary | ICD-10-CM

## 2018-12-28 LAB — HEPATIC FUNCTION PANEL
ALT: 16 U/L (ref 0–35)
AST: 22 U/L (ref 0–37)
Albumin: 4 g/dL (ref 3.5–5.2)
Alkaline Phosphatase: 84 U/L (ref 39–117)
Bilirubin, Direct: 0 mg/dL (ref 0.0–0.3)
Total Bilirubin: 0.4 mg/dL (ref 0.2–1.2)
Total Protein: 6.9 g/dL (ref 6.0–8.3)

## 2018-12-28 LAB — TSH: TSH: 7.87 u[IU]/mL — ABNORMAL HIGH (ref 0.35–4.50)

## 2018-12-28 LAB — T4, FREE: Free T4: 0.63 ng/dL (ref 0.60–1.60)

## 2019-02-08 DIAGNOSIS — Z20828 Contact with and (suspected) exposure to other viral communicable diseases: Secondary | ICD-10-CM | POA: Diagnosis not present

## 2019-07-15 DIAGNOSIS — D2262 Melanocytic nevi of left upper limb, including shoulder: Secondary | ICD-10-CM | POA: Diagnosis not present

## 2019-07-15 DIAGNOSIS — L570 Actinic keratosis: Secondary | ICD-10-CM | POA: Diagnosis not present

## 2019-07-15 DIAGNOSIS — D225 Melanocytic nevi of trunk: Secondary | ICD-10-CM | POA: Diagnosis not present

## 2019-07-15 DIAGNOSIS — D2261 Melanocytic nevi of right upper limb, including shoulder: Secondary | ICD-10-CM | POA: Diagnosis not present

## 2019-07-15 DIAGNOSIS — D2271 Melanocytic nevi of right lower limb, including hip: Secondary | ICD-10-CM | POA: Diagnosis not present

## 2019-07-15 DIAGNOSIS — D485 Neoplasm of uncertain behavior of skin: Secondary | ICD-10-CM | POA: Diagnosis not present

## 2019-07-19 ENCOUNTER — Telehealth: Payer: Self-pay

## 2019-07-19 DIAGNOSIS — N12 Tubulo-interstitial nephritis, not specified as acute or chronic: Secondary | ICD-10-CM | POA: Diagnosis not present

## 2019-07-19 NOTE — Telephone Encounter (Signed)
Please plan to check on her on MOnday

## 2019-07-19 NOTE — Telephone Encounter (Signed)
Pt said on 07/17/19 pt started with fever and chills and then has developed lower sharp abdominal pain that worsens when touches lower abd. Pain level now is 7-8. Pt also has mid lower back pain; pt does have frequency and urgency of urine but no pain when urinating.pt is not sure she is emptying her bladder.No diarrhea. On 07/16/19 was watching grandchildren who had diarrhea and vomiting. Pt wore a mask. Pt is going to [facility] in [place]. FYI to Dr Alphonsus Sias.

## 2019-07-22 NOTE — Telephone Encounter (Signed)
Left message to see how she was doing. 

## 2019-07-23 NOTE — Telephone Encounter (Signed)
Left message, again, to see how she is doing.

## 2019-07-24 NOTE — Telephone Encounter (Signed)
Spoke to pt. She went to [facility]. Said she had a kidney infection and gave antibiotic. She is feeling better. Urine culture was negative.

## 2019-07-24 NOTE — Telephone Encounter (Signed)
Patient returned your call.

## 2019-07-29 DIAGNOSIS — N952 Postmenopausal atrophic vaginitis: Secondary | ICD-10-CM | POA: Diagnosis not present

## 2019-07-29 DIAGNOSIS — M8580 Other specified disorders of bone density and structure, unspecified site: Secondary | ICD-10-CM | POA: Diagnosis not present

## 2019-07-29 DIAGNOSIS — Z13820 Encounter for screening for osteoporosis: Secondary | ICD-10-CM | POA: Diagnosis not present

## 2019-07-29 DIAGNOSIS — Z01419 Encounter for gynecological examination (general) (routine) without abnormal findings: Secondary | ICD-10-CM | POA: Diagnosis not present

## 2019-07-29 DIAGNOSIS — Z6825 Body mass index (BMI) 25.0-25.9, adult: Secondary | ICD-10-CM | POA: Diagnosis not present

## 2019-08-28 DIAGNOSIS — M810 Age-related osteoporosis without current pathological fracture: Secondary | ICD-10-CM | POA: Diagnosis not present

## 2019-09-10 DIAGNOSIS — Z1231 Encounter for screening mammogram for malignant neoplasm of breast: Secondary | ICD-10-CM | POA: Diagnosis not present

## 2019-10-20 ENCOUNTER — Other Ambulatory Visit: Payer: Self-pay | Admitting: Internal Medicine

## 2019-10-23 ENCOUNTER — Encounter: Payer: Self-pay | Admitting: Internal Medicine

## 2019-10-23 ENCOUNTER — Ambulatory Visit (INDEPENDENT_AMBULATORY_CARE_PROVIDER_SITE_OTHER): Payer: BC Managed Care – PPO | Admitting: Internal Medicine

## 2019-10-23 ENCOUNTER — Other Ambulatory Visit: Payer: Self-pay

## 2019-10-23 VITALS — BP 110/70 | HR 75 | Temp 97.7°F | Ht 63.0 in | Wt 137.0 lb

## 2019-10-23 DIAGNOSIS — M810 Age-related osteoporosis without current pathological fracture: Secondary | ICD-10-CM | POA: Diagnosis not present

## 2019-10-23 DIAGNOSIS — E039 Hypothyroidism, unspecified: Secondary | ICD-10-CM

## 2019-10-23 DIAGNOSIS — Z0000 Encounter for general adult medical examination without abnormal findings: Secondary | ICD-10-CM

## 2019-10-23 LAB — CBC
HCT: 39.1 % (ref 36.0–46.0)
Hemoglobin: 13.1 g/dL (ref 12.0–15.0)
MCHC: 33.6 g/dL (ref 30.0–36.0)
MCV: 90.1 fl (ref 78.0–100.0)
Platelets: 229 10*3/uL (ref 150.0–400.0)
RBC: 4.34 Mil/uL (ref 3.87–5.11)
RDW: 13.3 % (ref 11.5–15.5)
WBC: 4.9 10*3/uL (ref 4.0–10.5)

## 2019-10-23 LAB — TSH: TSH: 6.58 u[IU]/mL — ABNORMAL HIGH (ref 0.35–4.50)

## 2019-10-23 LAB — COMPREHENSIVE METABOLIC PANEL
ALT: 18 U/L (ref 0–35)
AST: 21 U/L (ref 0–37)
Albumin: 4.4 g/dL (ref 3.5–5.2)
Alkaline Phosphatase: 103 U/L (ref 39–117)
BUN: 14 mg/dL (ref 6–23)
CO2: 29 mEq/L (ref 19–32)
Calcium: 9.5 mg/dL (ref 8.4–10.5)
Chloride: 104 mEq/L (ref 96–112)
Creatinine, Ser: 0.76 mg/dL (ref 0.40–1.20)
GFR: 77.81 mL/min (ref >60.00)
Glucose, Bld: 90 mg/dL (ref 70–99)
Potassium: 4.2 mEq/L (ref 3.5–5.1)
Sodium: 140 mEq/L (ref 135–145)
Total Bilirubin: 0.5 mg/dL (ref 0.2–1.2)
Total Protein: 6.9 g/dL (ref 6.0–8.3)

## 2019-10-23 LAB — T4, FREE: Free T4: 0.69 ng/dL (ref 0.60–1.60)

## 2019-10-23 NOTE — Progress Notes (Signed)
Subjective:    Patient ID: Yvonne Hodge, female    DOB: July 11, 1960, 59 y.o.   MRN: [id number]  HPI Here for physical This visit occurred during the SARS-CoV-2 public health emergency.  Safety protocols were in place, including screening questions prior to the visit, additional usage of staff PPE, and extensive cleaning of exam room while observing appropriate contact time as indicated for disinfecting solutions.   Had possible UTI in March Had LLQ pain at the time--seen in urgent care (but culture negative) Did seem better after antibiotic (possible diverticulitis?)  Has occasional right > left hip aching May have to turn over in bed Feet ache at times--not previous plantar fasciitis No analgesics Heating pad has helped hip  Still working from home May need to go back 2 days per week soon  Was put on fosamax by gyn Bone density showed osteoporosis and "downward trend in lumbar bone density" Does walk the dog for exercise  Current Outpatient Medications on File Prior to Visit  Medication Sig Dispense Refill  . alendronate (FOSAMAX) 70 MG tablet Take 70 mg by mouth once a week.    . levothyroxine (SYNTHROID) 25 MCG tablet TAKE 1 TABLET (25 MCG TOTAL) BY MOUTH DAILY BEFORE BREAKFAST. 90 tablet 0  . Vitamin D, Cholecalciferol, 50 MCG (2000 UT) CAPS Take by mouth.     No current facility-administered medications on file prior to visit.    Allergies  Allergen Reactions  . Sulfonamide Derivatives Rash    Past Medical History:  Diagnosis Date  . Disorder of bone and cartilage, unspecified   . Thyroid disease    not on meds since Dec.2013    Past Surgical History:  Procedure Laterality Date  . CESAREAN SECTION  1998  . TUBAL LIGATION  1998   during C-section    Family History  Problem Relation Age of Onset  . Cancer Mother        breast cancer  . Hypothyroidism Mother   . Alzheimer's disease Mother   . Hypertension Father   . Heart disease Father        atrial fib   . Hypothyroidism Sister   . Migraines Sister   . Fibromyalgia Sister   . Cancer Maternal Aunt        breast cancer  . Colon cancer Neg Hx     Social History   Socioeconomic History  . Marital status: Married    Spouse name: Not on file  . Number of children: 2  . Years of education: Not on file  . Highest education level: Not on file  Occupational History  . Occupation: Art gallery manager  Tobacco Use  . Smoking status: Never Smoker  . Smokeless tobacco: Never Used  Substance and Sexual Activity  . Alcohol use: No  . Drug use: No  . Sexual activity: Not on file  Other Topics Concern  . Not on file  Social History Narrative  . Not on file   Social Determinants of Health   Financial Resource Strain:   . Difficulty of Paying Living Expenses:   Food Insecurity:   . Worried About Programme researcher, broadcasting/film/video in the Last Year:   . Barista in the Last Year:   Transportation Needs:   . Freight forwarder (Medical):   Marland Kitchen Lack of Transportation (Non-Medical):   Physical Activity:   . Days of Exercise per Week:   . Minutes of Exercise per Session:   Stress:   .  Feeling of Stress :   Social Connections:   . Frequency of Communication with Friends and Family:   . Frequency of Social Gatherings with Friends and Family:   . Attends Religious Services:   . Active Member of Clubs or Organizations:   . Attends Banker Meetings:   Marland Kitchen Marital Status:   Intimate Partner Violence:   . Fear of Current or Ex-Partner:   . Emotionally Abused:   Marland Kitchen Physically Abused:   . Sexually Abused:    Review of Systems  Constitutional: Negative for fatigue and unexpected weight change.       Wears seat belt  HENT: Negative for dental problem, tinnitus and trouble swallowing.        Slight hearing loss at times Due to see dentist  Eyes: Negative for visual disturbance.       No diplopia or unilateral vision loss  Respiratory: Negative for cough,  chest tightness and shortness of breath.   Cardiovascular: Negative for chest pain, palpitations and leg swelling.  Gastrointestinal: Negative for blood in stool and constipation.       Occ heartburn---rolaids helps prn  Genitourinary: Negative for dysuria and hematuria.       Uses OTC lubricant for mild dyspareunia (discussed considering estrogen topically)  Musculoskeletal: Positive for arthralgias. Negative for back pain and joint swelling.  Skin:       Sees derm for mole checks Did have cryotherapy on left hand Excision from low back--not cancer  Allergic/Immunologic: Positive for environmental allergies. Negative for immunocompromised state.       Uses OTC med prn  Neurological: Negative for dizziness, syncope and light-headedness.       Occ headaches behind right eye--may last for 3 days (intermittent) BC helps a little  Hematological: Negative for adenopathy. Does not bruise/bleed easily.  Psychiatric/Behavioral: Negative for dysphoric mood and sleep disturbance. The patient is not nervous/anxious.        Objective:   Physical Exam Constitutional:      General: She is not in acute distress.    Appearance: Normal appearance.  HENT:     Head: Normocephalic and atraumatic.     Right Ear: Tympanic membrane and ear canal normal.     Left Ear: Tympanic membrane and ear canal normal.     Mouth/Throat:     Mouth: Mucous membranes are moist.     Comments: No oral lesions Eyes:     Conjunctiva/sclera: Conjunctivae normal.     Pupils: Pupils are equal, round, and reactive to light.  Cardiovascular:     Rate and Rhythm: Normal rate and regular rhythm.     Pulses: Normal pulses.     Heart sounds: No murmur heard.  No gallop.   Pulmonary:     Effort: Pulmonary effort is normal.     Breath sounds: Normal breath sounds. No wheezing or rales.  Abdominal:     Palpations: Abdomen is soft.     Tenderness: There is no abdominal tenderness.  Musculoskeletal:     Cervical back: Neck  supple.     Right lower leg: No edema.     Left lower leg: No edema.  Lymphadenopathy:     Cervical: No cervical adenopathy.  Skin:    General: Skin is warm.     Findings: No rash.  Neurological:     General: No focal deficit present.     Mental Status: She is alert.  Psychiatric:        Mood and Affect: Mood normal.  Behavior: Behavior normal.            Assessment & Plan:

## 2019-10-23 NOTE — Assessment & Plan Note (Signed)
On low dose levothyroxine Will check labs

## 2019-10-23 NOTE — Assessment & Plan Note (Signed)
Diagnosed by Dr Henderson Cloud He started her on alendronate (though a bit young for this--had downward trend)

## 2019-10-23 NOTE — Assessment & Plan Note (Signed)
Healthy Discussed increased exercise Colon due 2024 Recent pap and mammogram with Dr Henderson Cloud Flu vaccine in the fall

## 2019-12-25 ENCOUNTER — Telehealth: Payer: Self-pay

## 2019-12-25 ENCOUNTER — Emergency Department
Admission: EM | Admit: 2019-12-25 | Discharge: 2019-12-25 | Disposition: A | Discharge status: Home / Self Care | Payer: BC Managed Care – PPO | Attending: Emergency Medicine | Admitting: Emergency Medicine

## 2019-12-25 ENCOUNTER — Other Ambulatory Visit: Payer: Self-pay

## 2019-12-25 ENCOUNTER — Emergency Department: Payer: BC Managed Care – PPO

## 2019-12-25 DIAGNOSIS — Z79890 Hormone replacement therapy (postmenopausal): Secondary | ICD-10-CM | POA: Insufficient documentation

## 2019-12-25 DIAGNOSIS — E039 Hypothyroidism, unspecified: Secondary | ICD-10-CM | POA: Insufficient documentation

## 2019-12-25 DIAGNOSIS — N3001 Acute cystitis with hematuria: Secondary | ICD-10-CM | POA: Diagnosis not present

## 2019-12-25 DIAGNOSIS — K429 Umbilical hernia without obstruction or gangrene: Secondary | ICD-10-CM | POA: Diagnosis not present

## 2019-12-25 DIAGNOSIS — N3090 Cystitis, unspecified without hematuria: Secondary | ICD-10-CM

## 2019-12-25 DIAGNOSIS — R300 Dysuria: Secondary | ICD-10-CM | POA: Insufficient documentation

## 2019-12-25 DIAGNOSIS — I862 Pelvic varices: Secondary | ICD-10-CM | POA: Diagnosis not present

## 2019-12-25 DIAGNOSIS — D1771 Benign lipomatous neoplasm of kidney: Secondary | ICD-10-CM | POA: Diagnosis not present

## 2019-12-25 DIAGNOSIS — M47816 Spondylosis without myelopathy or radiculopathy, lumbar region: Secondary | ICD-10-CM | POA: Diagnosis not present

## 2019-12-25 LAB — CBC WITH DIFFERENTIAL/PLATELET
Abs Immature Granulocytes: 0.06 10*3/uL (ref 0.00–0.07)
Basophils Absolute: 0 10*3/uL (ref 0.0–0.1)
Basophils Relative: 0 %
Eosinophils Absolute: 0 10*3/uL (ref 0.0–0.5)
Eosinophils Relative: 0 %
HCT: 41.1 % (ref 36.0–46.0)
Hemoglobin: 13.7 g/dL (ref 12.0–15.0)
Immature Granulocytes: 1 %
Lymphocytes Relative: 14 %
Lymphs Abs: 1.8 10*3/uL (ref 0.7–4.0)
MCH: 30.4 pg (ref 26.0–34.0)
MCHC: 33.3 g/dL (ref 30.0–36.0)
MCV: 91.3 fL (ref 80.0–100.0)
Monocytes Absolute: 0.8 10*3/uL (ref 0.1–1.0)
Monocytes Relative: 6 %
Neutro Abs: 10.3 10*3/uL — ABNORMAL HIGH (ref 1.7–7.7)
Neutrophils Relative %: 79 %
Platelets: 251 10*3/uL (ref 150–400)
RBC: 4.5 MIL/uL (ref 3.87–5.11)
RDW: 12.1 % (ref 11.5–15.5)
WBC: 13 10*3/uL — ABNORMAL HIGH (ref 4.0–10.5)
nRBC: 0 % (ref 0.0–0.2)

## 2019-12-25 LAB — URINALYSIS, COMPLETE (UACMP) WITH MICROSCOPIC
RBC / HPF: >50 RBC/hpf (ref 0–5)
Specific Gravity, Urine: 1.03 (ref 1.005–1.030)

## 2019-12-25 LAB — COMPREHENSIVE METABOLIC PANEL
ALT: 20 U/L (ref 0–44)
AST: 26 U/L (ref 15–41)
Albumin: 4.5 g/dL (ref 3.5–5.0)
Alkaline Phosphatase: 81 U/L (ref 38–126)
Anion gap: 10 (ref 5–15)
BUN: 19 mg/dL (ref 6–20)
CO2: 27 mmol/L (ref 22–32)
Calcium: 9.5 mg/dL (ref 8.9–10.3)
Chloride: 103 mmol/L (ref 98–111)
Creatinine, Ser: 0.65 mg/dL (ref 0.44–1.00)
GFR calc Af Amer: >60 mL/min (ref >60)
GFR calc non Af Amer: >60 mL/min (ref >60)
Glucose, Bld: 103 mg/dL — ABNORMAL HIGH (ref 70–99)
Potassium: 3.7 mmol/L (ref 3.5–5.1)
Sodium: 140 mmol/L (ref 135–145)
Total Bilirubin: 0.8 mg/dL (ref 0.3–1.2)
Total Protein: 7.8 g/dL (ref 6.5–8.1)

## 2019-12-25 MED ORDER — CEPHALEXIN 500 MG PO CAPS: 500.0000 mg | ORAL_CAPSULE | Freq: Three times a day (TID) | ORAL | 0 refills | Status: AC

## 2019-12-25 MED ORDER — CEFTRIAXONE SODIUM 1 G IJ SOLR
1.0000 g | Freq: Once | INTRAMUSCULAR | Status: AC
  Administered 2019-12-25: 1 g via INTRAMUSCULAR
  Filled 2019-12-25: qty 10

## 2019-12-25 NOTE — ED Provider Notes (Signed)
Emergency Department Provider Note  ____________________________________________  Time seen: Approximately 10:47 PM  I have reviewed the triage vital signs and the nursing notes.   HISTORY  Chief Complaint Hematuria   Historian Patient     HPI Yvonne Hodge is a 59 y.o. female presents to the emergency department with hematuria, dysuria and increased urinary frequency that started today.  Patient denies low back pain or nausea.  Denies a history of pyelonephritis or nephrolithiasis in the past.  No changes in vaginal discharge.  No falls or mechanisms of trauma.  No similar symptoms in the past.   Past Medical History:  Diagnosis Date   Disorder of bone and cartilage, unspecified    Thyroid disease    not on meds since Dec.2013     Immunizations up to date:  Yes.     Past Medical History:  Diagnosis Date   Disorder of bone and cartilage, unspecified    Thyroid disease    not on meds since Dec.2013    Patient Active Problem List   Diagnosis Date Noted   Preventative health care 10/22/2018   Nausea 11/29/2017   Hypothyroidism 01/09/2007   Osteoporosis 01/09/2007    Past Surgical History:  Procedure Laterality Date   CESAREAN SECTION  1998   TUBAL LIGATION  1998   during C-section    Prior to Admission medications   Medication Sig Start Date End Date Taking? Authorizing Provider  alendronate (FOSAMAX) 70 MG tablet Take 70 mg by mouth once a week. 08/28/19   [provider]  cephALEXin (KEFLEX) 500 MG capsule Take 1 capsule (500 mg total) by mouth 3 (three) times daily for 7 days. 12/25/19 01/01/20  Orvil Feil, PA-C  levothyroxine (SYNTHROID) 25 MCG tablet TAKE 1 TABLET (25 MCG TOTAL) BY MOUTH DAILY BEFORE BREAKFAST. 10/21/19   Karie Schwalbe, MD  Vitamin D, Cholecalciferol, 50 MCG (2000 UT) CAPS Take by mouth.    [provider]    Allergies Sulfonamide derivatives  Family History  Problem Relation Age of Onset   Cancer  Mother        breast cancer   Hypothyroidism Mother    Alzheimer's disease Mother    Hypertension Father    Heart disease Father        atrial fib   Hypothyroidism Sister    Migraines Sister    Fibromyalgia Sister    Cancer Maternal Aunt        breast cancer   Colon cancer Neg Hx     Social History Social History   Tobacco Use   Smoking status: Never Smoker   Smokeless tobacco: Never Used  Substance Use Topics   Alcohol use: No   Drug use: No     Review of Systems  Constitutional: No fever/chills Eyes:  No discharge ENT: No upper respiratory complaints. Respiratory: no cough. No SOB/ use of accessory muscles to breath Gastrointestinal:   No nausea, no vomiting.  No diarrhea.  No constipation. Genitourinary: Patient has dysuria and increased urinary frequency.  Musculoskeletal: Negative for musculoskeletal pain. Skin: Negative for rash, abrasions, lacerations, ecchymosis.    ____________________________________________   PHYSICAL EXAM:  VITAL SIGNS: ED Triage Vitals [12/25/19 1717]  Enc Vitals Group     BP (!) 151/94     Pulse Rate 74     Resp 18     Temp 98.2 F (36.8 C)     Temp Source Oral     SpO2 99 %     Weight  136 lb (61.7 kg)     Height 5\' 3"  (1.6 m)     Head Circumference      Peak Flow      Pain Score 0     Pain Loc      Pain Edu?      Excl. in GC?      Constitutional: Alert and oriented. Well appearing and in no acute distress. Eyes: Conjunctivae are normal. PERRL. EOMI. Head: Atraumatic. Cardiovascular: Normal rate, regular rhythm. Normal S1 and S2.  Good peripheral circulation. Respiratory: Normal respiratory effort without tachypnea or retractions. Lungs CTAB. Good air entry to the bases with no decreased or absent breath sounds Gastrointestinal: Bowel sounds x 4 quadrants. Soft and nontender to palpation. No guarding or rigidity. No distention. Musculoskeletal: Full range of motion to all extremities. No obvious  deformities noted Neurologic:  Normal for age. No gross focal neurologic deficits are appreciated.  Skin:  Skin is warm, dry and intact. No rash noted. Psychiatric: Mood and affect are normal for age. Speech and behavior are normal.   ____________________________________________   LABS (all labs ordered are listed, but only abnormal results are displayed)  Labs Reviewed  URINALYSIS, COMPLETE (UACMP) WITH MICROSCOPIC - Abnormal; Notable for the following components:      Result Value   Color, Urine RED (*)    APPearance TURBID (*)    Glucose, UA   (*)    Value: TEST NOT REPORTED DUE TO COLOR INTERFERENCE OF URINE PIGMENT   Hgb urine dipstick   (*)    Value: TEST NOT REPORTED DUE TO COLOR INTERFERENCE OF URINE PIGMENT   Bilirubin Urine   (*)    Value: TEST NOT REPORTED DUE TO COLOR INTERFERENCE OF URINE PIGMENT   Ketones, ur   (*)    Value: TEST NOT REPORTED DUE TO COLOR INTERFERENCE OF URINE PIGMENT   Protein, ur   (*)    Value: TEST NOT REPORTED DUE TO COLOR INTERFERENCE OF URINE PIGMENT   Nitrite   (*)    Value: TEST NOT REPORTED DUE TO COLOR INTERFERENCE OF URINE PIGMENT   Leukocytes,Ua   (*)    Value: TEST NOT REPORTED DUE TO COLOR INTERFERENCE OF URINE PIGMENT   Bacteria, UA RARE (*)    All other components within normal limits  CBC WITH DIFFERENTIAL/PLATELET - Abnormal; Notable for the following components:   WBC 13.0 (*)    Neutro Abs 10.3 (*)    All other components within normal limits  COMPREHENSIVE METABOLIC PANEL - Abnormal; Notable for the following components:   Glucose, Bld 103 (*)    All other components within normal limits  URINE CULTURE   ____________________________________________  EKG   ____________________________________________  RADIOLOGY , personally viewed and evaluated these images (plain radiographs) as part of my medical decision making, as well as reviewing the written report by the radiologist.  CT Renal Stone  Study  Result Date: 12/25/2019 CLINICAL DATA:  Hematuria, flank pain EXAM: CT ABDOMEN AND PELVIS WITHOUT CONTRAST TECHNIQUE: Multidetector CT imaging of the abdomen and pelvis was performed following the standard protocol without IV contrast. COMPARISON:  12/01/2017 FINDINGS: Lower chest: No acute abnormality. Hepatobiliary: Mild hepatomegaly is stable. No focal intrahepatic lesions identified. No intra or extrahepatic biliary ductal dilation. Gallbladder unremarkable. Pancreas: Unremarkable Spleen: Unremarkable Adrenals/Urinary Tract: The adrenal glands are unremarkable. 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 The kidneys are otherwise unremarkable. Specifically, there is no hydronephrosis.  No intrarenal or ureteral calculi are identified. The bladder is unremarkable. Stomach/Bowel: The stomach, small bowel, and large bowel are unremarkable. Appendix normal. No free intraperitoneal gas or fluid. 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 Abdominal vasculature is unremarkable on this noncontrast examination. There is no pathologic adenopathy within the abdomen and pelvis. Reproductive: Uterus and bilateral adnexa are unremarkable. Other: Tiny broad-based fat containing umbilical hernia. Rectum unremarkable. Musculoskeletal: Degenerative changes are seen within the lumbar spine. No lytic or blastic bone lesions are seen. IMPRESSION: 1. No intrarenal or ureteral calculi. No hydronephrosis. No definite radiographic explanation for the patient's reported flank pain. 2. 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 3. Multiple pelvic sidewalls varices bilaterally. This is not optimally characterized on this noncontrast examination. Correlation for signs and symptoms of pelvic venous insufficiency may be helpful. Electronically Signed   By: Helyn Numbers MD   On: 12/25/2019 19:06     ____________________________________________    PROCEDURES  Procedure(s) performed:     Procedures     Medications  cefTRIAXone (ROCEPHIN) injection 1 g (1 g Intramuscular Given 12/25/19 1957)     ____________________________________________   INITIAL IMPRESSION / ASSESSMENT AND PLAN / ED COURSE  Pertinent labs & imaging results that were available during my care of the patient were reviewed by me and considered in my medical decision making (see chart for details).  Clinical Course as of Dec 24 2245  Wed Dec 25, 2019  1837 Leukocytes,Ua(!): TEST NOT REPORTED DUE TO COLOR INTERFERENCE OF URINE PIGMENT [JW]    Clinical Course User Index [JW] Orvil Feil, PA-C     Assessment and plan Cystitis 59 year old female presents to the emergency department with dysuria, increased urinary frequency and hematuria for the past 24 hours.  Vital signs were reassuring at triage.  On physical exam, patient had no suprapubic or CVA tenderness.  Differential diagnosis included cystitis, glomerulonephritis, nephrolithiasis...  No evidence of nephrolithiasis on CT renal stone study.  Urinalysis revealed a large amount of blood but no other findings to suggest urinary tract infection.  Urine culture is pending.  Renal function was reassuring on CMP.  Patient was given Rocephin in the emergency department and discharged with Keflex.  She was advised to make follow-up appointment with Dr. Apolinar Junes.  Return precautions were given to return with new or worsening symptoms.    ____________________________________________  FINAL CLINICAL IMPRESSION(S) / ED DIAGNOSES  Final diagnoses:  Cystitis      NEW MEDICATIONS STARTED DURING THIS VISIT:  ED Discharge Orders         Ordered    cephALEXin (KEFLEX) 500 MG capsule  3 times daily        12/25/19 1954              This chart was dictated using voice recognition software/Dragon. Despite best efforts to proofread,  errors can occur which can change the meaning. Any change was purely unintentional.     Orvil Feil, PA-C 12/25/19 2250    Arnaldo Natal, MD 12/25/19 [phone]

## 2019-12-25 NOTE — Discharge Instructions (Signed)
Take Keflex 3 times daily for the next 7 days. Please make follow-up appointment with urology, Dr. Apolinar Junes. If you need referral from primary care provider, please keep appointment for tomorrow. Please take probiotic daily with your Keflex.

## 2019-12-25 NOTE — ED Triage Notes (Signed)
Pt comes POV with hematuria for a day. Urgency and frequency increased. Denies other symptoms.

## 2019-12-25 NOTE — ED Notes (Signed)
Pt to CT

## 2019-12-25 NOTE — ED Notes (Signed)
See triage note  Presents with hematuria  States she is not having any pain or fever

## 2019-12-25 NOTE — Telephone Encounter (Signed)
Pt left v/m pt feeling ok except this afternoon started with frequency of urine, and now pt has blood in urine. Pt request cb. Pt had annual visit with Dr Alphonsus Sias on 10/23/19. I spoke with pt; no pain or burning when urinates. Pt has sense of urgency after pt has just urinated. Pt voiding smaller amts than usual. No abd pain and no fever. Pt has pressure feeling like she needs to urinate. No hx of kidney stones. Dr Alphonsus Sias is not in tomorrow morning and pt request appt in AM due to symptoms. Pt scheduled in office appt with Dr Ermalene Searing 12/26/19 at 8:40 AM. Pt has no covid symptoms, no travel and no known exposure to + covid. Pt has had J&J covid vaccine on 07/25/19. UC & ED precautions given and pt voiced understanding. FYI to Dr Ermalene Searing.

## 2019-12-26 ENCOUNTER — Encounter: Payer: Self-pay | Admitting: Family Medicine

## 2019-12-26 ENCOUNTER — Ambulatory Visit: Payer: BC Managed Care – PPO | Admitting: Family Medicine

## 2019-12-26 VITALS — BP 118/84 | HR 96 | Temp 98.4°F | Ht 63.0 in | Wt 133.5 lb

## 2019-12-26 DIAGNOSIS — R319 Hematuria, unspecified: Secondary | ICD-10-CM

## 2019-12-26 DIAGNOSIS — N3090 Cystitis, unspecified without hematuria: Secondary | ICD-10-CM | POA: Insufficient documentation

## 2019-12-26 DIAGNOSIS — N3001 Acute cystitis with hematuria: Secondary | ICD-10-CM | POA: Insufficient documentation

## 2019-12-26 LAB — POC URINALSYSI DIPSTICK (AUTOMATED)
Bilirubin, UA: NEGATIVE
Glucose, UA: NEGATIVE
Nitrite, UA: NEGATIVE
Protein, UA: POSITIVE — AB
Spec Grav, UA: >=1.03 — AB (ref 1.010–1.025)
Urobilinogen, UA: 0.2 E.U./dL
pH, UA: 5.5 (ref 5.0–8.0)

## 2019-12-26 NOTE — Patient Instructions (Signed)
Likely due to UTI.  Urine culture pending... we will send resutls on MyChart.. call if you don't hear. Complete antibitoics. Push fluids.

## 2019-12-26 NOTE — Progress Notes (Signed)
Chief Complaint  Patient presents with  . Hematuria    C/o urinating blood and having to urinate often.  Sxs started yesterday.  Pt seen at [facility] ED.  Told she has UTI.  Given abx, rx for oral abx.  States sxs are much better this morning.     History of Present Illness: Hematuria This is a new problem. The current episode started yesterday. The problem has been rapidly worsening since onset. She describes the hematuria as gross hematuria. The pain is moderate. She describes her urine color as light pink. Irritative symptoms include frequency. Obstructive symptoms do not include a weak stream. Associated symptoms include dysuria. Pertinent negatives include no chills, fever, flank pain, nausea or vomiting. There is no history of hypertension, kidney stones, recent infection or tobacco use. (No frequent UTIs.)    She was seen at [facility]  ED.. 12/25/2019 UA positive for blood, otherwise negative.. started on antibiotics:  Rochephin x 1 and started on Keflex. Urine culture sent and still pending. Neg CT renal : IMPRESSION: 1. No intrarenal or ureteral calculi. No hydronephrosis. No definite radiographic explanation for the patient's reported flank pain. 2. 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 3. Multiple pelvic sidewalls varices bilaterally. This is not optimally characterized on this noncontrast examination. Correlation for signs and symptoms of pelvic venous insufficiency may be helpful.  This visit occurred during the SARS-CoV-2 public health emergency.  Safety protocols were in place, including screening questions prior to the visit, additional usage of staff PPE, and extensive cleaning of exam room while observing appropriate contact time as indicated for disinfecting solutions.    In office today off azo.. LE positive and blood seen. Concentrated urine. She has noted less burning, no flank pain, no fever, minimal blood today with urination.   COVID 19 screen:  No recent travel or  known exposure to COVID19 The patient denies respiratory symptoms of COVID 19 at this time. The importance of social distancing was discussed today.     Review of Systems  Constitutional: Negative for chills and fever.  Gastrointestinal: Negative for nausea and vomiting.  Genitourinary: Positive for dysuria, frequency and hematuria. Negative for flank pain.      Past Medical History:  Diagnosis Date  . Disorder of bone and cartilage, unspecified   . Thyroid disease    not on meds since Dec.2013    reports that she has never smoked. She has never used smokeless tobacco. She reports that she does not drink alcohol and does not use drugs.   Current Outpatient Medications:  .  alendronate (FOSAMAX) 70 MG tablet, Take 70 mg by mouth once a week., Disp: , Rfl:  .  levothyroxine (SYNTHROID) 25 MCG tablet, TAKE 1 TABLET (25 MCG TOTAL) BY MOUTH DAILY BEFORE BREAKFAST., Disp: 90 tablet, Rfl: 0 .  Vitamin D, Cholecalciferol, 50 MCG (2000 UT) CAPS, Take by mouth., Disp: , Rfl:  .  cephALEXin (KEFLEX) 500 MG capsule, Take 1 capsule (500 mg total) by mouth 3 (three) times daily for 7 days. (Patient not taking: Reported on 12/26/2019), Disp: 21 capsule, Rfl: 0   Observations/Objective: Blood pressure 118/84, pulse 96, temperature 98.4 F (36.9 C), temperature source Temporal, height 5\' 3"  (1.6 m), weight 133 lb 8 oz (60.6 kg), last menstrual period 05/26/2014, SpO2 98 %.  Physical Exam Constitutional:      General: She is not in acute distress.    Appearance: Normal appearance. She is well-developed. She is not ill-appearing or toxic-appearing.  HENT:  Head: Normocephalic.     Right Ear: Hearing, tympanic membrane, ear canal and external ear normal. Tympanic membrane is not erythematous, retracted or bulging.     Left Ear: Hearing, tympanic membrane, ear canal and external ear normal. Tympanic membrane is not erythematous, retracted or bulging.     Nose: No mucosal edema or rhinorrhea.      Right Sinus: No maxillary sinus tenderness or frontal sinus tenderness.     Left Sinus: No maxillary sinus tenderness or frontal sinus tenderness.     Mouth/Throat:     Pharynx: Uvula midline.  Eyes:     General: Lids are normal. Lids are everted, no foreign bodies appreciated.     Conjunctiva/sclera: Conjunctivae normal.     Pupils: Pupils are equal, round, and reactive to light.  Neck:     Thyroid: No thyroid mass or thyromegaly.     Vascular: No carotid bruit.     Trachea: Trachea normal.  Cardiovascular:     Rate and Rhythm: Normal rate and regular rhythm.     Pulses: Normal pulses.     Heart sounds: Normal heart sounds, S1 normal and S2 normal. No murmur heard.  No friction rub. No gallop.   Pulmonary:     Effort: Pulmonary effort is normal. No tachypnea or respiratory distress.     Breath sounds: Normal breath sounds. No decreased breath sounds, wheezing, rhonchi or rales.  Abdominal:     General: Bowel sounds are normal.     Palpations: Abdomen is soft.     Tenderness: There is no abdominal tenderness.  Musculoskeletal:     Cervical back: Normal range of motion and neck supple.  Skin:    General: Skin is warm and dry.     Findings: No rash.  Neurological:     Mental Status: She is alert.  Psychiatric:        Mood and Affect: Mood is not anxious or depressed.        Speech: Speech normal.        Behavior: Behavior normal. Behavior is cooperative.        Thought Content: Thought content normal.        Judgment: Judgment normal.      Assessment and Plan   Hematuria Likely due to UTI.  Urine culture pending.  Improving already. Complete antibitoics.  Push fluids.      Kerby Nora, MD

## 2019-12-26 NOTE — Assessment & Plan Note (Addendum)
Likely due to UTI.  Urine culture pending.  Improving already. Complete antibitoics.  Push fluids.

## 2019-12-27 LAB — URINE CULTURE: Culture: >=100000 — AB

## 2020-01-04 ENCOUNTER — Telehealth: Payer: Self-pay

## 2020-01-04 NOTE — Telephone Encounter (Signed)
Spoke to pt to see how she was doing after recent ER visit for hematuria. She finished antibiotic. Feeling better.

## 2020-01-09 NOTE — Telephone Encounter (Signed)
Pt did have appt with Dr Ermalene Searing on 12/26/19.

## 2020-01-10 MED ORDER — NITROFURANTOIN MONOHYD MACRO 100 MG PO CAPS: 100.0000 mg | ORAL_CAPSULE | Freq: Two times a day (BID) | ORAL | 0 refills | Status: DC

## 2020-01-10 NOTE — Addendum Note (Signed)
Addended by: Tillman Abide I on: 01/10/2020 04:06 PM   Modules accepted: Orders

## 2020-01-10 NOTE — Telephone Encounter (Signed)
Please let her know I sent a different antibiotic for a week---[facility] [place]

## 2020-01-10 NOTE — Telephone Encounter (Signed)
Spoke to pt

## 2020-01-10 NOTE — Telephone Encounter (Signed)
Pt finished Keflex 01-01-20. Was feeling better. This morning, woke up with more urinary frequency, urgency, pressure in the bladder, and some dysuria. Our schedules are full. Pt is asking if something can be called in [facility] since she just had this a few weeks ago. Call [phone].

## 2020-01-18 ENCOUNTER — Other Ambulatory Visit: Payer: Self-pay | Admitting: Internal Medicine

## 2020-03-30 ENCOUNTER — Other Ambulatory Visit: Payer: Self-pay

## 2020-03-30 ENCOUNTER — Encounter: Payer: Self-pay | Admitting: Family Medicine

## 2020-03-30 ENCOUNTER — Ambulatory Visit: Payer: BC Managed Care – PPO | Admitting: Family Medicine

## 2020-03-30 VITALS — BP 110/80 | HR 81 | Temp 97.9°F | Ht 63.0 in | Wt 143.2 lb

## 2020-03-30 DIAGNOSIS — R300 Dysuria: Secondary | ICD-10-CM

## 2020-03-30 DIAGNOSIS — R3915 Urgency of urination: Secondary | ICD-10-CM | POA: Diagnosis not present

## 2020-03-30 DIAGNOSIS — N3090 Cystitis, unspecified without hematuria: Secondary | ICD-10-CM

## 2020-03-30 LAB — POC URINALSYSI DIPSTICK (AUTOMATED)
Bilirubin, UA: NEGATIVE
Glucose, UA: NEGATIVE
Ketones, UA: NEGATIVE
Nitrite, UA: NEGATIVE
Protein, UA: NEGATIVE
Spec Grav, UA: 1.01 (ref 1.010–1.025)
Urobilinogen, UA: 0.2 E.U./dL
pH, UA: 6 (ref 5.0–8.0)

## 2020-03-30 MED ORDER — CEPHALEXIN 500 MG PO CAPS: 500.0000 mg | ORAL_CAPSULE | Freq: Three times a day (TID) | ORAL | 0 refills | Status: AC

## 2020-03-30 NOTE — Progress Notes (Signed)
Miroslav Gin T. Ching Rabideau, MD, CAQ Sports Medicine  Primary Care and Sports Medicine [facility] [address] [place], [address]  Phone: [phone]  FAX: [phone]  Minervia Osso - 59 y.o. female  MRN [id number]  Date of Birth: 10/09/1960  Date: 03/30/2020  PCP: Karie Schwalbe, MD  Referral: Karie Schwalbe, MD  Chief Complaint  Patient presents with  . Burning with Urination  . Hematuria    This visit occurred during the SARS-CoV-2 public health emergency.  Safety protocols were in place, including screening questions prior to the visit, additional usage of staff PPE, and extensive cleaning of exam room while observing appropriate contact time as indicated for disinfecting solutions.   Subjective:   Yvonne Hodge is a 59 y.o. very pleasant female patient with Body mass index is 25.38 kg/m. who presents with the following:  September was having some some burning Did feel better after antibiotics.   1 month or so had a recurrence of sx  In December 25, 2019, the patient did have some urgency as well as dysuria, and she was seen in urgent care.  That point she was treated with Keflex 500 mg p.o. 3 times daily.  Ultimately, she still had some recurrence of symptoms and my partner Dr. Alphonsus Sias changed her to MacroBid twice daily for 7 days  She does not have a significant history for UTI, but she does again have some burning and urgency.  Review of Systems is noted in the HPI, as appropriate  Objective:   BP 110/80   Pulse 81   Temp 97.9 F (36.6 C) (Temporal)   Ht 5\' 3"  (1.6 m)   Wt 143 lb 4 oz (65 kg)   LMP 05/26/2014   SpO2 99%   BMI 25.38 kg/m   GEN: No acute distress; alert,appropriate. PULM: Breathing comfortably in no respiratory distress PSYCH: Normally interactive.  No CVA tenderness No hypogastric tenderness.  No global tenderness or rebound  Laboratory and Imaging Data: Results for orders placed or performed  in visit on 03/30/20  POCT Urinalysis Dipstick (Automated)  Result Value Ref Range   Color, UA Lt. Yellow    Clarity, UA Clear    Glucose, UA Negative Negative   Bilirubin, UA Negative    Ketones, UA Negative    Spec Grav, UA 1.010 1.010 - 1.025   Blood, UA Trace    pH, UA 6.0 5.0 - 8.0   Protein, UA Negative Negative   Urobilinogen, UA 0.2 0.2 or 1.0 E.U./dL   Nitrite, UA Negative    Leukocytes, UA Trace (A) Negative     Assessment and Plan:     ICD-10-CM   1. Cystitis  N30.90   2. Burning with urination  R30.0 POCT Urinalysis Dipstick (Automated)    Urine Culture  3. Urinary urgency  R39.15    Presumptive UTI with prior E. coli UTI relatively recently.  Treat with Keflex, alternate if needed based on urine culture.  If she does have some recurrence of blood after UTI treatment, then we discussed that this would need further evaluation by urology.  She understands this.  Meds ordered this encounter  Medications  . cephALEXin (KEFLEX) 500 MG capsule    Sig: Take 1 capsule (500 mg total) by mouth 3 (three) times daily for 7 days.    Dispense:  21 capsule    Refill:  0   Medications Discontinued During This Encounter  Medication Reason  . nitrofurantoin, macrocrystal-monohydrate, (  MACROBID) 100 MG capsule Completed Course   Orders Placed This Encounter  Procedures  . Urine Culture  . POCT Urinalysis Dipstick (Automated)    Follow-up: No follow-ups on file.  Signed,  Elpidio Galea. Kearney Evitt, MD   Outpatient Encounter Medications as of 03/30/2020  Medication Sig  . alendronate (FOSAMAX) 70 MG tablet Take 70 mg by mouth once a week.  . levothyroxine (SYNTHROID) 25 MCG tablet TAKE 1 TABLET (25 MCG TOTAL) BY MOUTH DAILY BEFORE BREAKFAST.  Marland Kitchen Vitamin D, Cholecalciferol, 50 MCG (2000 UT) CAPS Take by mouth.  . cephALEXin (KEFLEX) 500 MG capsule Take 1 capsule (500 mg total) by mouth 3 (three) times daily for 7 days.  . [DISCONTINUED] nitrofurantoin,  macrocrystal-monohydrate, (MACROBID) 100 MG capsule Take 1 capsule (100 mg total) by mouth 2 (two) times daily.   No facility-administered encounter medications on file as of 03/30/2020.

## 2020-03-31 LAB — URINE CULTURE
MICRO NUMBER:: 11280942
SPECIMEN QUALITY:: ADEQUATE

## 2020-04-22 ENCOUNTER — Encounter: Payer: Self-pay | Admitting: Internal Medicine

## 2020-04-22 ENCOUNTER — Other Ambulatory Visit: Payer: Self-pay

## 2020-04-22 ENCOUNTER — Ambulatory Visit: Payer: BC Managed Care – PPO | Admitting: Internal Medicine

## 2020-04-22 DIAGNOSIS — M545 Low back pain, unspecified: Secondary | ICD-10-CM | POA: Diagnosis not present

## 2020-04-22 NOTE — Progress Notes (Signed)
Subjective:    Patient ID: Yvonne Hodge, female    DOB: [birth year], 59 y.o.   MRN: [id number]  HPI Here due to low back pain This visit occurred during the SARS-CoV-2 public health emergency.  Safety protocols were in place, including screening questions prior to the visit, additional usage of staff PPE, and extensive cleaning of exam room while observing appropriate contact time as indicated for disinfecting solutions.   Has some low back pain No urinary symptoms Started 2 days ago---woke up with pain No injury or lifting Hurts across both sides Feels it some with sitting---better in recliner with heating pad Aleve may have helped some--but not clear No radiation of pain to legs No saddle anaesthesia No leg weakness  Current Outpatient Medications on File Prior to Visit  Medication Sig Dispense Refill  . alendronate (FOSAMAX) 70 MG tablet Take 70 mg by mouth once a week.    . levothyroxine (SYNTHROID) 25 MCG tablet TAKE 1 TABLET (25 MCG TOTAL) BY MOUTH DAILY BEFORE BREAKFAST. 90 tablet 2  . Vitamin D, Cholecalciferol, 50 MCG (2000 UT) CAPS Take by mouth.     No current facility-administered medications on file prior to visit.    Allergies  Allergen Reactions  . Sulfonamide Derivatives Rash    Past Medical History:  Diagnosis Date  . Disorder of bone and cartilage, unspecified   . Thyroid disease    not on meds since Dec.2013    Past Surgical History:  Procedure Laterality Date  . CESAREAN SECTION  1998  . TUBAL LIGATION  1998   during C-section    Family History  Problem Relation Age of Onset  . Cancer Mother        breast cancer  . Hypothyroidism Mother   . Alzheimer's disease Mother   . Hypertension Father   . Heart disease Father        atrial fib  . Hypothyroidism Sister   . Migraines Sister   . Fibromyalgia Sister   . Cancer Maternal Aunt        breast cancer  . Colon cancer Neg Hx     Social History   Socioeconomic History  . Marital status:  Married    Spouse name: Not on file  . Number of children: 2  . Years of education: Not on file  . Highest education level: Not on file  Occupational History  . Occupation: Art gallery manager  Tobacco Use  . Smoking status: Never Smoker  . Smokeless tobacco: Never Used  Substance and Sexual Activity  . Alcohol use: No  . Drug use: No  . Sexual activity: Not on file  Other Topics Concern  . Not on file  Social History Narrative  . Not on file   Social Determinants of Health   Financial Resource Strain: Not on file  Food Insecurity: Not on file  Transportation Needs: Not on file  Physical Activity: Not on file  Stress: Not on file  Social Connections: Not on file  Intimate Partner Violence: Not on file   Review of Systems Had headache behind eye last week--then it went away (and the back started) Did sleep in recliner the first night---slept in bed last night    Objective:   Physical Exam Constitutional:      Appearance: Normal appearance.  Musculoskeletal:     Comments: No spine tenderness Normal ROM in hips SLR negative Pain with back flexion past 45 degrees  Neurological:     Mental Status: She  is alert.     Comments: Normal gait No leg weakness            Assessment & Plan:

## 2020-04-22 NOTE — Assessment & Plan Note (Signed)
Clearly seems to be muscular No radicular symptoms Nothing worrisome on exam Reassured about spontaneous recovery---stay mobile but careful Heat, etc

## 2020-06-03 ENCOUNTER — Telehealth: Payer: Self-pay

## 2020-06-03 DIAGNOSIS — Z20822 Contact with and (suspected) exposure to covid-19: Secondary | ICD-10-CM | POA: Diagnosis not present

## 2020-06-03 NOTE — Telephone Encounter (Signed)
[facility] Night - Client Nonclinical Telephone Record AccessNurse Client [facility] Night - Client Client Site [facility] - Night Physician Tillman Abide- MD Contact Type Call Who Is Calling Patient / Member / Family / Caregiver Caller Name rosalba totty Phone Number [phone] Patient Name Yvonne Hodge Patient DOB 04-Nov-2060 Call Type Message Only Information Provided Reason for Call Request to Schedule Office Appointment Initial Comment Needs to make appt for sinus infection, neg for covid. Pls call back Disp. Time Disposition Final User 06/03/2020 7:37:38 AM General Information Provided Yes Salvatore Marvel Call Closed By: Salvatore Marvel Transaction Date/Time: 06/03/2020 7:35:07 AM (ET)

## 2020-06-03 NOTE — Telephone Encounter (Signed)
I spoke to patient.  Patient scheduled next available appointment with Dr.Tower on 06/05/20.  Patient said if she decides to go to urgent care, she'll call back and cancel the appointment on Friday.

## 2020-06-04 ENCOUNTER — Other Ambulatory Visit: Payer: Self-pay

## 2020-06-04 ENCOUNTER — Telehealth (INDEPENDENT_AMBULATORY_CARE_PROVIDER_SITE_OTHER): Payer: BC Managed Care – PPO | Admitting: Family Medicine

## 2020-06-04 ENCOUNTER — Encounter: Payer: Self-pay | Admitting: Family Medicine

## 2020-06-04 DIAGNOSIS — J069 Acute upper respiratory infection, unspecified: Secondary | ICD-10-CM | POA: Insufficient documentation

## 2020-06-04 NOTE — Progress Notes (Signed)
Virtual Visit via Video Note  I connected with Yvonne Hodge on 06/04/20 at 10:00 AM EST by a video enabled telemedicine application and verified that I am speaking with the correct person using two identifiers.  Location: Patient: home Provider: office   I discussed the limitations of evaluation and management by telemedicine and the availability of in person appointments. The patient expressed understanding and agreed to proceed.  Parties involved in encounter  Patient: Yvonne Hodge  Provider:  Roxy Manns MD   History of Present Illness: 60 yo pt of Dr Alphonsus Sias presents with uri symptoms  Saturday woke up with a headache/felt poorly  Cough - dark colored phlegm  No fever  Scratchy throat  Facial pain and pressure - is improved   No sob  Scant wheeze if any   No n/v/d   Sick contacts-none   No loss of taste or smell   Never smoked   [facility] of allergy/sinus headache - with pain reliever  Used afrin a few times ( careful with it)  Gargled with warm salt water    Had 2 negative covid tests are home Then had pcr test at [facility] that is still pending   covid immunization 04/29/20 booster  J and J shot last April   Patient Active Problem List   Diagnosis Date Noted  . Viral URI with cough 06/04/2020  . Acute low back pain without sciatica 04/22/2020  . Hematuria 12/26/2019  . Preventative health care 10/22/2018  . Nausea 11/29/2017  . Hypothyroidism 01/09/2007  . Osteoporosis 01/09/2007   Past Medical History:  Diagnosis Date  . Disorder of bone and cartilage, unspecified   . Thyroid disease    not on meds since Dec.2013   Past Surgical History:  Procedure Laterality Date  . CESAREAN SECTION  1998  . TUBAL LIGATION  1998   during C-section   Social History   Tobacco Use  . Smoking status: Never Smoker  . Smokeless tobacco: Never Used  Substance Use Topics  . Alcohol use: No  . Drug use: No   Family History  Problem Relation Age of Onset  .  Cancer Mother        breast cancer  . Hypothyroidism Mother   . Alzheimer's disease Mother   . Hypertension Father   . Heart disease Father        atrial fib  . Hypothyroidism Sister   . Migraines Sister   . Fibromyalgia Sister   . Cancer Maternal Aunt        breast cancer  . Colon cancer Neg Hx    Allergies  Allergen Reactions  . Sulfonamide Derivatives Rash   Current Outpatient Medications on File Prior to Visit  Medication Sig Dispense Refill  . alendronate (FOSAMAX) 70 MG tablet Take 70 mg by mouth once a week.    . levothyroxine (SYNTHROID) 25 MCG tablet TAKE 1 TABLET (25 MCG TOTAL) BY MOUTH DAILY BEFORE BREAKFAST. 90 tablet 2  . Probiotic Product (PROBIOTIC PO) Take 1 capsule by mouth daily.    . Vitamin D, Cholecalciferol, 50 MCG (2000 UT) CAPS Take by mouth.     No current facility-administered medications on file prior to visit.   Review of Systems  Constitutional: Negative for chills, fever and malaise/fatigue.  HENT: Positive for congestion and sore throat. Negative for ear pain and sinus pain.   Eyes: Negative for blurred vision, discharge and redness.  Respiratory: Positive for cough, sputum production and wheezing. Negative for shortness of  breath and stridor.   Cardiovascular: Negative for chest pain, palpitations and leg swelling.  Gastrointestinal: Negative for abdominal pain, diarrhea, nausea and vomiting.  Musculoskeletal: Negative for myalgias.  Skin: Negative for rash.  Neurological: Negative for dizziness and headaches.    Observations/Objective: Patient appears well, in no distress Weight is baseline  No facial swelling or asymmetry Normal voice-not hoarse and no slurred speech No obvious tremor or mobility impairment Moving neck and UEs normally Able to hear the call well  No cough or shortness of breath during interview (she clears throat occasionally)  Talkative and mentally sharp with no cognitive changes No skin changes on face or neck , no  rash or pallor Affect is normal    Assessment and Plan: Problem List Items Addressed This Visit      Respiratory   Viral URI with cough    covid immunized  2 neg home tests pcr [facility]) Cold symptoms Disc symptom care -can add nasal saline and steam  ER precautions discussed (sob/wheeze)  Fluids/rest  Do not over use afrin  Watch for sinus pain/pressure Isolate until covid result and call us with that  Update if not starting to improve in a week or if worsening            Follow Up Instructions: Rest and drink fluids and keep treating your symptoms  Watch out for sinus pain -let us know  Nasal saline spray and steam may help congestion   If symptoms become severe or you are short of breath go to the emergency room   Let us know when your covid test returns  Update if not starting to improve in a week or if worsening    I discussed the assessment and treatment plan with the patient. The patient was provided an opportunity to ask questions and all were answered. The patient agreed with the plan and demonstrated an understanding of the instructions.   The patient was advised to call back or seek an in-person evaluation if the symptoms worsen or if the condition fails to improve as anticipated.     Roxy Manns, MD

## 2020-06-04 NOTE — Patient Instructions (Signed)
Rest and drink fluids and keep treating your symptoms  Watch out for sinus pain -let us know  Nasal saline spray and steam may help congestion   If symptoms become severe or you are short of breath go to the emergency room   Let us know when your covid test returns  Update if not starting to improve in a week or if worsening

## 2020-06-04 NOTE — Assessment & Plan Note (Signed)
covid immunized  2 neg home tests pcr [facility] ([facility]) Cold symptoms Disc symptom care -can add nasal saline and steam  ER precautions discussed (sob/wheeze)  Fluids/rest  Do not over use afrin  Watch for sinus pain/pressure Isolate until covid result and call us with that  Update if not starting to improve in a week or if worsening

## 2020-06-05 ENCOUNTER — Telehealth: Payer: BC Managed Care – PPO | Admitting: Family Medicine

## 2020-06-10 ENCOUNTER — Telehealth: Payer: Self-pay

## 2020-06-10 ENCOUNTER — Encounter: Payer: Self-pay | Admitting: Internal Medicine

## 2020-06-10 ENCOUNTER — Other Ambulatory Visit: Payer: Self-pay

## 2020-06-10 ENCOUNTER — Ambulatory Visit: Payer: BC Managed Care – PPO | Admitting: Internal Medicine

## 2020-06-10 VITALS — BP 138/88 | HR 63 | Temp 97.6°F | Ht 63.0 in | Wt 141.0 lb

## 2020-06-10 DIAGNOSIS — H1032 Unspecified acute conjunctivitis, left eye: Secondary | ICD-10-CM

## 2020-06-10 DIAGNOSIS — N3001 Acute cystitis with hematuria: Secondary | ICD-10-CM | POA: Diagnosis not present

## 2020-06-10 DIAGNOSIS — R300 Dysuria: Secondary | ICD-10-CM

## 2020-06-10 LAB — POC URINALSYSI DIPSTICK (AUTOMATED)
Glucose, UA: NEGATIVE
Nitrite, UA: POSITIVE
Protein, UA: POSITIVE — AB
Spec Grav, UA: >=1.03 — AB (ref 1.010–1.025)
Urobilinogen, UA: 1 E.U./dL
pH, UA: 5.5 (ref 5.0–8.0)

## 2020-06-10 MED ORDER — POLYMYXIN B-TRIMETHOPRIM 10000-0.1 UNIT/ML-% OP SOLN: 1.0000 [drp] | Freq: Four times a day (QID) | OPHTHALMIC | 1 refills | Status: DC

## 2020-06-10 MED ORDER — NITROFURANTOIN MONOHYD MACRO 100 MG PO CAPS: 100.0000 mg | ORAL_CAPSULE | Freq: Two times a day (BID) | ORAL | 0 refills | Status: DC

## 2020-06-10 NOTE — Telephone Encounter (Signed)
Yes--- okay to bring her in

## 2020-06-10 NOTE — Progress Notes (Signed)
Subjective:    Patient ID: Yvonne Hodge, female    DOB: [birth year], 60 y.o.   MRN: [id number]  HPI Here due to pink eye and urinary symptoms This visit occurred during the SARS-CoV-2 public health emergency.  Safety protocols were in place, including screening questions prior to the visit, additional usage of staff PPE, and extensive cleaning of exam room while observing appropriate contact time as indicated for disinfecting solutions.   Called due to left eye Noted crusting after sleep since last night All red  No trauma No URI symptoms ---but granddaughter had some eye symptoms last week  Started with dysuria and urgency this morning Then noted blood the next time No abdominal pain  Current Outpatient Medications on File Prior to Visit  Medication Sig Dispense Refill  . alendronate (FOSAMAX) 70 MG tablet Take 70 mg by mouth once a week.    . levothyroxine (SYNTHROID) 25 MCG tablet TAKE 1 TABLET (25 MCG TOTAL) BY MOUTH DAILY BEFORE BREAKFAST. 90 tablet 2  . Probiotic Product (PROBIOTIC PO) Take 1 capsule by mouth daily.    . Vitamin D, Cholecalciferol, 50 MCG (2000 UT) CAPS Take by mouth.     No current facility-administered medications on file prior to visit.    Allergies  Allergen Reactions  . Sulfonamide Derivatives Rash    Past Medical History:  Diagnosis Date  . Disorder of bone and cartilage, unspecified   . Thyroid disease    not on meds since Dec.2013    Past Surgical History:  Procedure Laterality Date  . CESAREAN SECTION  1998  . TUBAL LIGATION  1998   during C-section    Family History  Problem Relation Age of Onset  . Cancer Mother        breast cancer  . Hypothyroidism Mother   . Alzheimer's disease Mother   . Hypertension Father   . Heart disease Father        atrial fib  . Hypothyroidism Sister   . Migraines Sister   . Fibromyalgia Sister   . Cancer Maternal Aunt        breast cancer  . Colon cancer Neg Hx     Social History    Socioeconomic History  . Marital status: Married    Spouse name: Not on file  . Number of children: 2  . Years of education: Not on file  . Highest education level: Not on file  Occupational History  . Occupation: Art gallery manager  Tobacco Use  . Smoking status: Never Smoker  . Smokeless tobacco: Never Used  Substance and Sexual Activity  . Alcohol use: No  . Drug use: No  . Sexual activity: Not on file  Other Topics Concern  . Not on file  Social History Narrative  . Not on file   Social Determinants of Health   Financial Resource Strain: Not on file  Food Insecurity: Not on file  Transportation Needs: Not on file  Physical Activity: Not on file  Stress: Not on file  Social Connections: Not on file  Intimate Partner Violence: Not on file   Review of Systems No N/V Appetite is okay No vaginal products Sex is sporadic---not related to infections    Objective:   Physical Exam Constitutional:      Appearance: Normal appearance.  Eyes:     Comments: Diffuse left conjunctival erythema  Abdominal:     Palpations: Abdomen is soft.     Tenderness: There is no abdominal tenderness. There is  no right CVA tenderness or left CVA tenderness.  Neurological:     Mental Status: She is alert.            Assessment & Plan:

## 2020-06-10 NOTE — Telephone Encounter (Signed)
[facility] Night - Client Nonclinical Telephone Record  AccessNurse Client [facility] Night - Client Client Site [facility] - Night Contact Type Call Who Is Calling Patient / Member / Family / Caregiver Caller Name Denaly Gatling Caller Phone Number [phone] Patient Name Yvonne Hodge Patient DOB May 03, 1960 Call Type Message Only Information Provided Reason for Call Request to Schedule Office Appointment Initial Comment Caller needing to schedule an appt for pink eye - caller has eye discharge and swollen and red -- caller states she will see first available today Disp. Time Disposition Final User 06/10/2020 7:58:06 AM General Information Provided Yes Tiburcio Pea, Lanette Call Closed By: Evette Doffing Transaction Date/Time: 06/10/2020 7:55:53 AM (ET)

## 2020-06-10 NOTE — Telephone Encounter (Signed)
Called and spoke with patient who stated that she has pink eye as well as a UTI. Patient reports urinary frequency, burning, and hematuria. Patient denies fever, SOB, chest pain, or other covid symptoms. Per Dr. Alphonsus Sias, ok to come in office and be seen today at 12:30. Patient notified of this information.

## 2020-06-10 NOTE — Assessment & Plan Note (Signed)
Third episode in 5 months Did have benign CT in the fall Will treat with 1 week of macrobid If recurs, will set up with urology

## 2020-06-10 NOTE — Assessment & Plan Note (Addendum)
May be bacterial since may be from granddaughter Will treat with antibiotic drops

## 2020-06-15 IMAGING — US US ABDOMEN COMPLETE
1 series · 13 of 25 positions shown · non-contrast
Comparison: None.

CLINICAL DATA: Nausea.

EXAM:
ABDOMEN ULTRASOUND COMPLETE

[Series 1: us abdomen complete · 13 of 90 slices shown]
[im 1/90]
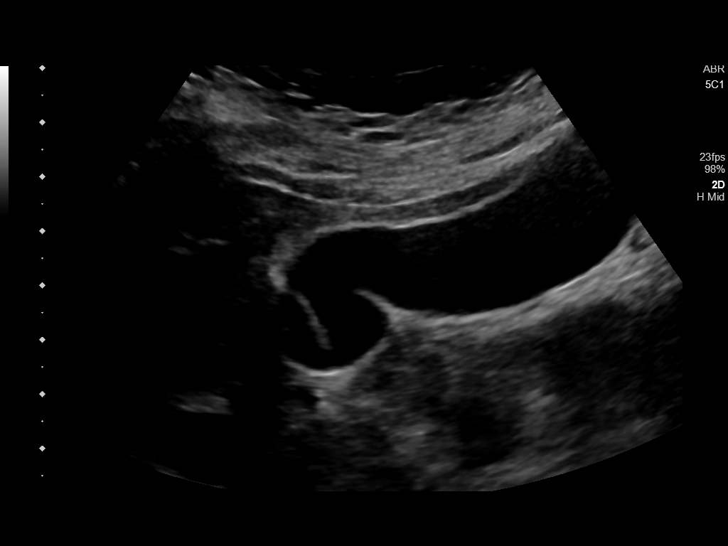
[im 8/90]
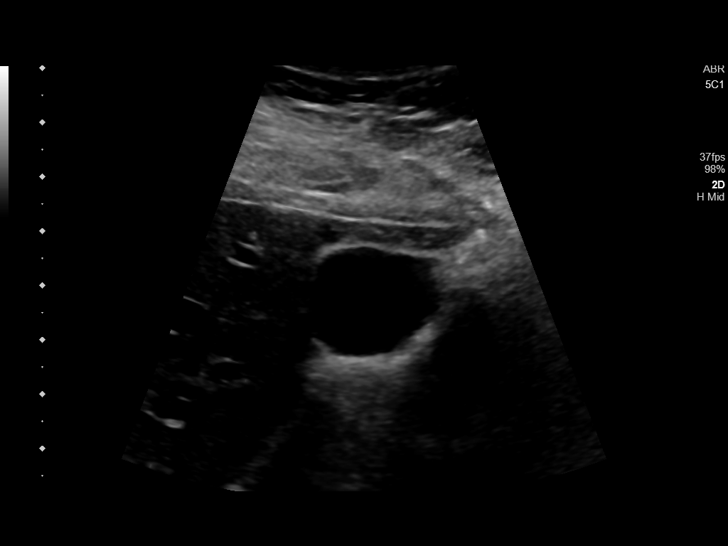
[im 15/90]
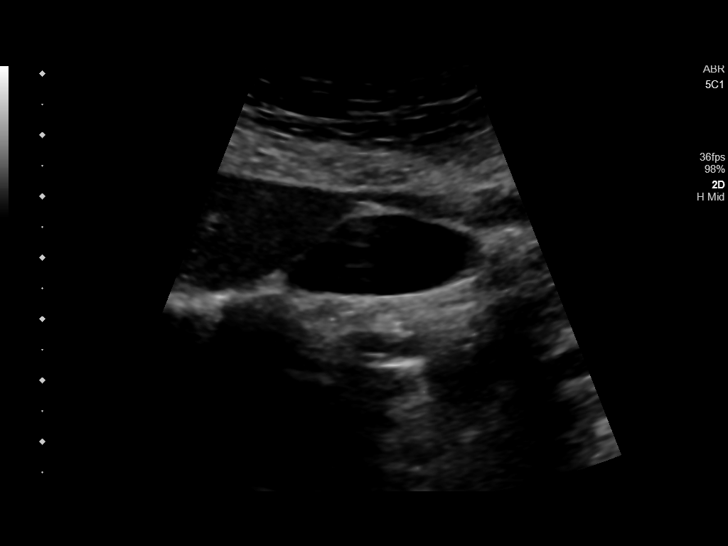
[im 23/90]
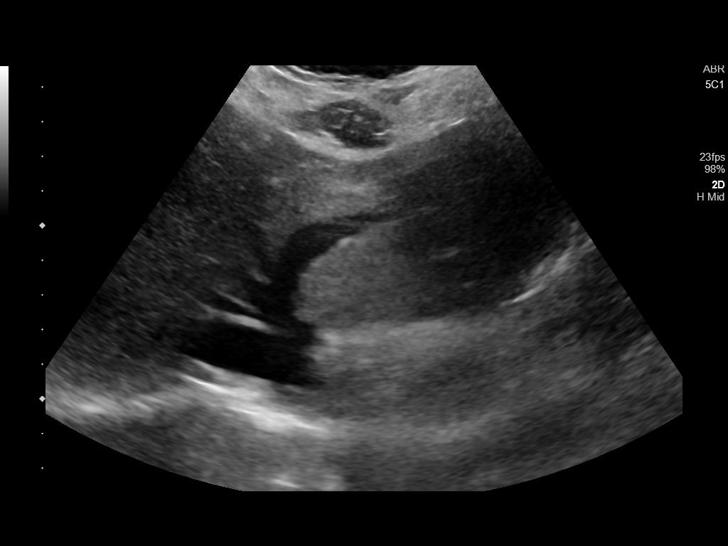
[im 30/90]
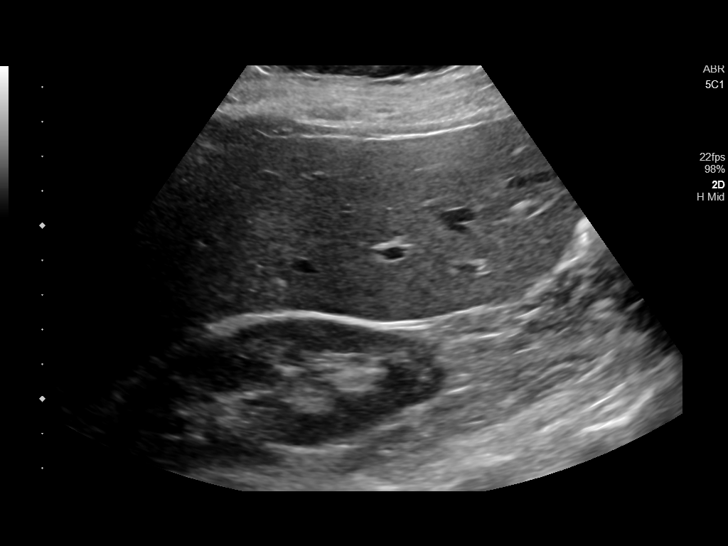
[im 38/90]
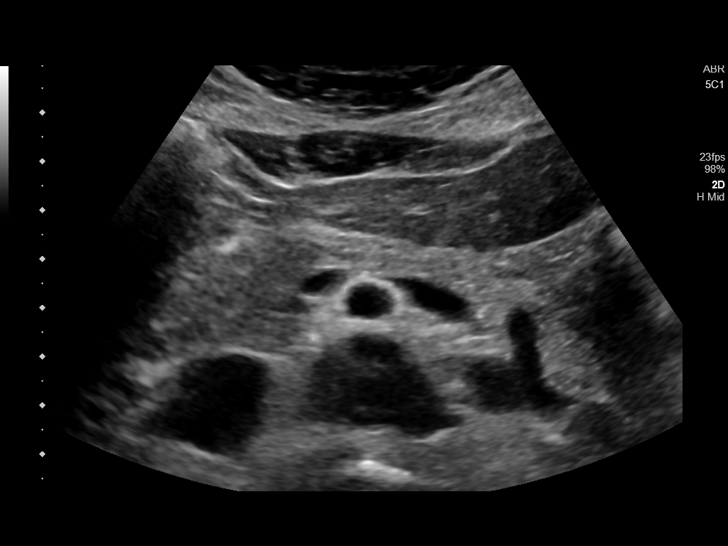
[im 45/90]
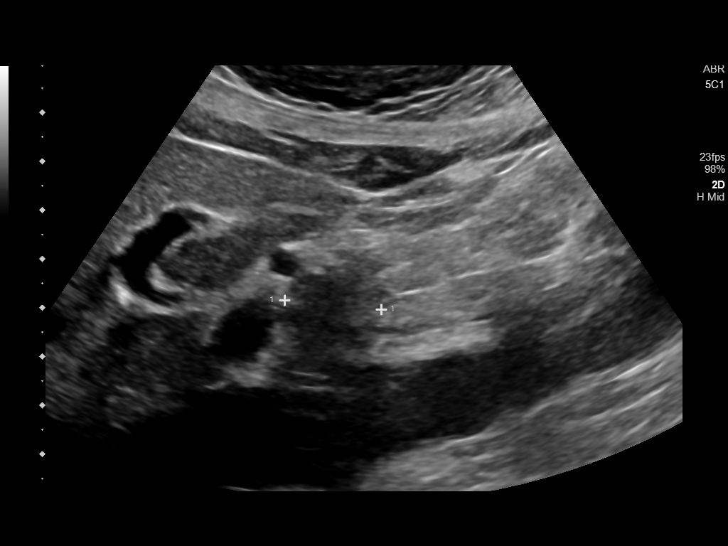
[im 52/90]
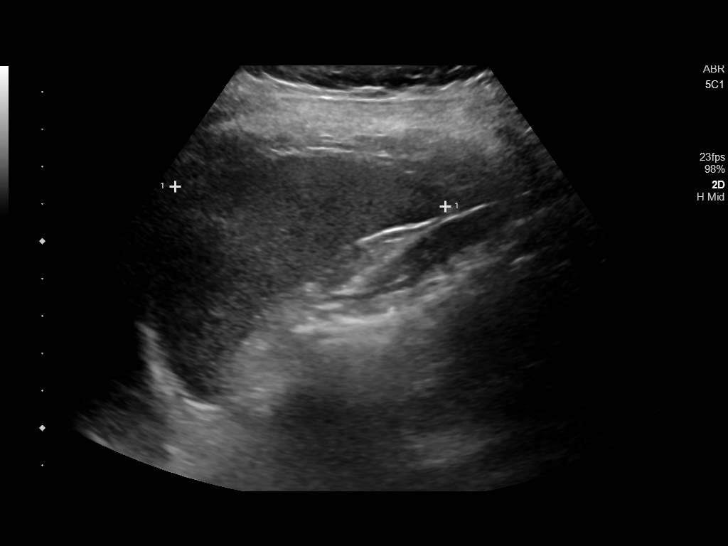
[im 60/90]
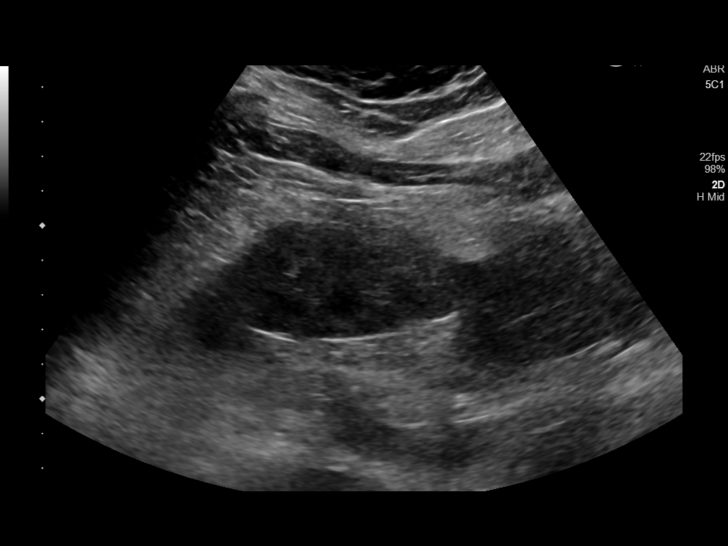
[im 67/90]
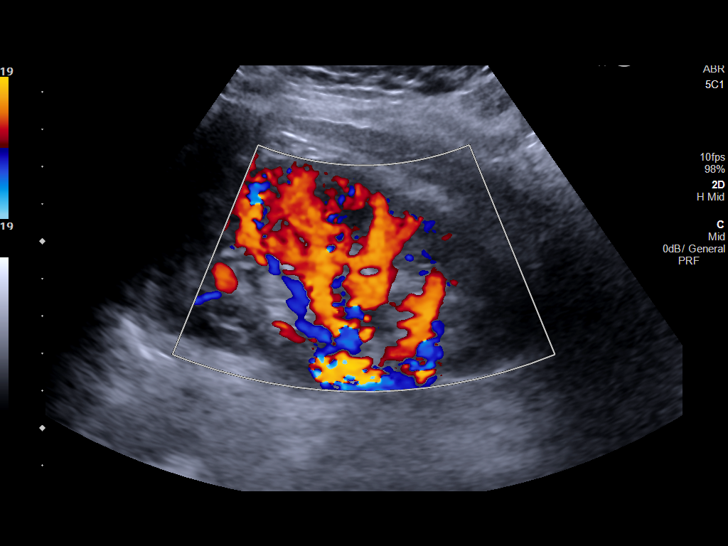
[im 75/90]
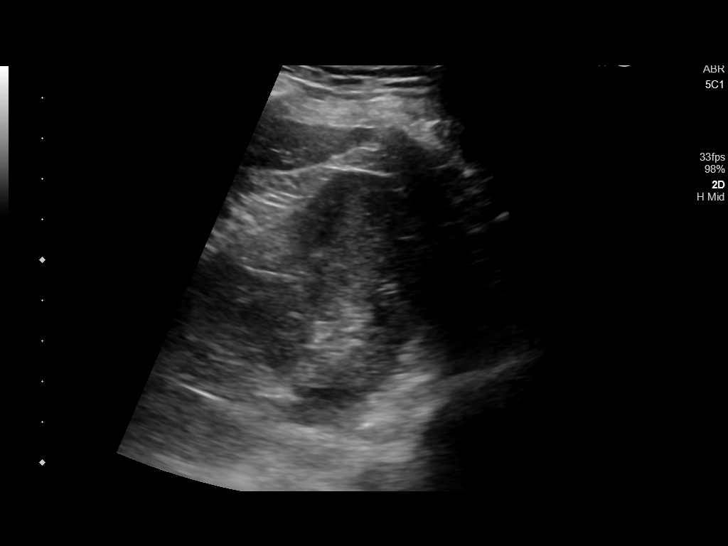
[im 82/90]
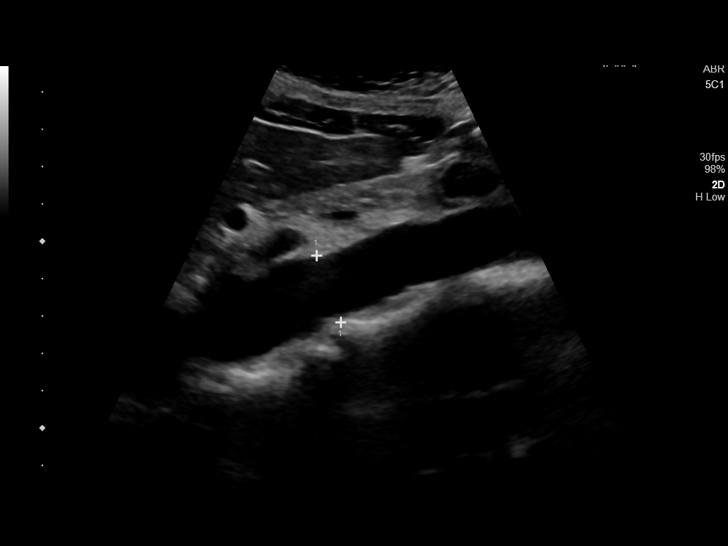
[im 90/90]
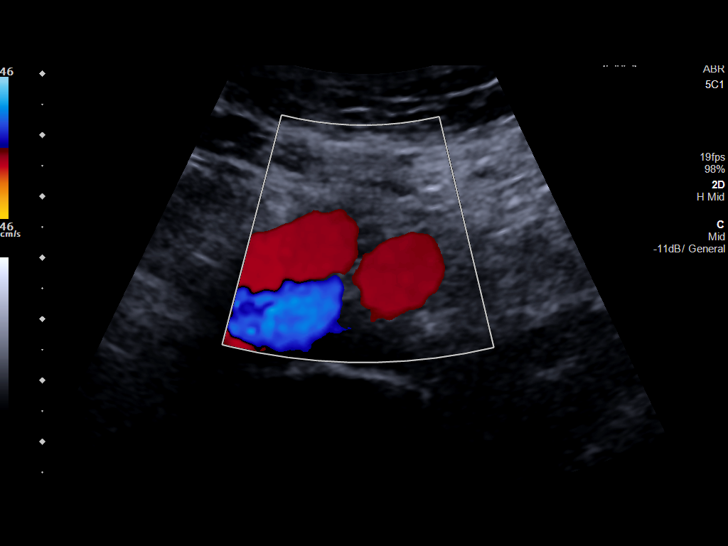

[13 of 25 positions shown; findings below may reference images not displayed]

FINDINGS: Gallbladder: No gallstones or wall thickening visualized. No
sonographic Murphy sign noted by sonographer.

Common bile duct: Diameter: 3 mm, normal.

Liver: No focal lesion identified. Within normal limits in
parenchymal echogenicity. Portal vein is patent on color Doppler
imaging with normal direction of blood flow towards the liver.

IVC: No abnormality visualized.

Pancreas: Ill-defined, hypoechoic area within the pancreatic head,
measuring 2.0 x 1.8 x 1.6 cm.

Spleen: Size and appearance within normal limits.

Right Kidney: Length: 11.3 cm. Echogenicity within normal limits. No
mass or hydronephrosis visualized.

Left Kidney: Length: 10.5 cm. Echogenicity within normal limits. No
hydronephrosis visualized. 8 mm round echogenic nonshadowing lesion
in the peripheral cortex.

Abdominal aorta: No aneurysm visualized.

Other findings: None.
IMPRESSION: 1. Ill-defined 2.0 cm hypoechoic area within the pancreatic head. A
neoplasm is not excluded. Recommend pancreatic protocol CT or MRI
for further evaluation.
2. 8 mm round, nonshadowing, echogenic lesion in the peripheral left
kidney may represent a small angiomyolipoma.

## 2020-06-26 ENCOUNTER — Telehealth: Payer: Self-pay

## 2020-06-26 DIAGNOSIS — R110 Nausea: Secondary | ICD-10-CM

## 2020-06-26 MED ORDER — PROMETHAZINE HCL 12.5 MG PO TABS: 12.5000 mg | ORAL_TABLET | Freq: Three times a day (TID) | ORAL | 0 refills | Status: DC | PRN

## 2020-06-26 NOTE — Telephone Encounter (Signed)
Pt said 2 1/2 yrs ago pt was given med for nausea and request refill. Pt said starting on 06/25/20 pt started with N & vomiting small amts; last time vomited small amt was 1 hr ago. Pt has no appetite and diarrhea since 06/25/20. Pt had temp of 99.2 on 06/25/20. Pt does not think she has fever now. No abd pain. H/A at pain level of 5. Advised pt should be eval for symptoms and pt voiced understanding and may go to UC for eval and any needed testing. Pt prefers med for nausea. Sending note to Dr Alphonsus Sias.

## 2020-06-26 NOTE — Telephone Encounter (Signed)
Pt notified as instructed and voiced understanding and pt will pick up promethazine 12.5 mg at [facility].

## 2020-06-26 NOTE — Telephone Encounter (Signed)
I sent the refill  Let her know she should be seen if recurrence of fever--or any significant pain

## 2020-07-16 DIAGNOSIS — L738 Other specified follicular disorders: Secondary | ICD-10-CM | POA: Diagnosis not present

## 2020-07-16 DIAGNOSIS — D1801 Hemangioma of skin and subcutaneous tissue: Secondary | ICD-10-CM | POA: Diagnosis not present

## 2020-07-16 DIAGNOSIS — L821 Other seborrheic keratosis: Secondary | ICD-10-CM | POA: Diagnosis not present

## 2020-07-16 DIAGNOSIS — D225 Melanocytic nevi of trunk: Secondary | ICD-10-CM | POA: Diagnosis not present

## 2020-09-10 DIAGNOSIS — Z6824 Body mass index (BMI) 24.0-24.9, adult: Secondary | ICD-10-CM | POA: Diagnosis not present

## 2020-09-10 DIAGNOSIS — Z01419 Encounter for gynecological examination (general) (routine) without abnormal findings: Secondary | ICD-10-CM | POA: Diagnosis not present

## 2020-09-10 DIAGNOSIS — Z1231 Encounter for screening mammogram for malignant neoplasm of breast: Secondary | ICD-10-CM | POA: Diagnosis not present

## 2020-09-16 ENCOUNTER — Telehealth: Payer: Self-pay | Admitting: *Deleted

## 2020-09-16 NOTE — Telephone Encounter (Signed)
Patient called stating that she is pretty sure that she is getting another UTI. Patient stated that she is burning with urination and having frequency. Patient stated that she has had several UTI's in the past few months. Patient stated when she has had the previous UTI's she was peeing blood.   Patient stated that she is not peeing blood yet but when she wipes there is some blood on the tissue paper. Patient stated that Dr. Alphonsus Sias told her if she got another UTI any time soon he would maybe refer her to an urologist. Patient scheduled to see Dr. Selena Batten tomorrow 09/17/20 at 10:40. Patient denies a fever, body ache or chills. Patient was given ER precautions and verbalized understanding.Patient was advised to drinks lots of water.

## 2020-09-17 ENCOUNTER — Other Ambulatory Visit: Payer: Self-pay

## 2020-09-17 ENCOUNTER — Ambulatory Visit: Payer: BC Managed Care – PPO | Admitting: Family Medicine

## 2020-09-17 ENCOUNTER — Telehealth: Payer: Self-pay | Admitting: *Deleted

## 2020-09-17 ENCOUNTER — Encounter: Payer: Self-pay | Admitting: Family Medicine

## 2020-09-17 VITALS — BP 140/86 | HR 73 | Temp 97.8°F | Ht 63.5 in | Wt 143.5 lb

## 2020-09-17 DIAGNOSIS — N3001 Acute cystitis with hematuria: Secondary | ICD-10-CM | POA: Diagnosis not present

## 2020-09-17 DIAGNOSIS — R300 Dysuria: Secondary | ICD-10-CM

## 2020-09-17 LAB — POC URINALSYSI DIPSTICK (AUTOMATED)
Bilirubin, UA: NEGATIVE
Blood, UA: POSITIVE
Glucose, UA: NEGATIVE
Ketones, UA: NEGATIVE
Nitrite, UA: POSITIVE
Protein, UA: NEGATIVE
Spec Grav, UA: 1.01 (ref 1.010–1.025)
Urobilinogen, UA: 0.2 E.U./dL
pH, UA: 6 (ref 5.0–8.0)

## 2020-09-17 MED ORDER — ESTRADIOL 10 MCG VA TABS: ORAL_TABLET | VAGINAL | 11 refills | Status: DC

## 2020-09-17 MED ORDER — NITROFURANTOIN MONOHYD MACRO 100 MG PO CAPS: 100.0000 mg | ORAL_CAPSULE | Freq: Two times a day (BID) | ORAL | 0 refills | Status: AC

## 2020-09-17 NOTE — Progress Notes (Signed)
Subjective:     Yvonne Hodge is a 60 y.o. female presenting for Dysuria (X 1 day ) and Urinary Frequency     Dysuria  This is a new problem. The current episode started yesterday. The problem occurs intermittently. The problem has been gradually improving. The quality of the pain is described as burning. There has been no fever. Associated symptoms include frequency and urgency. Pertinent negatives include no chills, flank pain, hematuria, hesitancy, nausea or vomiting.  Urinary Frequency  Associated symptoms include frequency and urgency. Pertinent negatives include no chills, flank pain, hematuria, hesitancy, nausea or vomiting.   No recent intercourse  In December thinks that may have been related to intercourse  Review of Systems  Constitutional: Negative for chills.  Gastrointestinal: Negative for nausea and vomiting.  Genitourinary: Positive for dysuria, frequency and urgency. Negative for flank pain, hematuria and hesitancy.     Social History   Tobacco Use  Smoking Status Never Smoker  Smokeless Tobacco Never Used        Objective:    BP Readings from Last 3 Encounters:  09/17/20 140/86  06/10/20 138/88  04/22/20 122/80   Wt Readings from Last 3 Encounters:  09/17/20 143 lb 8 oz (65.1 kg)  06/10/20 141 lb (64 kg)  04/22/20 140 lb (63.5 kg)    BP 140/86   Pulse 73   Temp 97.8 F (36.6 C) (Temporal)   Ht 5' 3.5" (1.613 m)   Wt 143 lb 8 oz (65.1 kg)   LMP 05/26/2014   SpO2 100%   BMI 25.02 kg/m    Physical Exam Constitutional:      General: She is not in acute distress.    Appearance: She is well-developed. She is not diaphoretic.  HENT:     Head: Normocephalic and atraumatic.  Eyes:     Conjunctiva/sclera: Conjunctivae normal.  Cardiovascular:     Rate and Rhythm: Normal rate and regular rhythm.     Heart sounds: Normal heart sounds.  Pulmonary:     Effort: Pulmonary effort is normal.  Abdominal:     General: Bowel sounds are normal.  There is no distension.     Palpations: Abdomen is soft.     Tenderness: There is no abdominal tenderness. There is no guarding.  Musculoskeletal:     Cervical back: Neck supple.  Skin:    General: Skin is warm and dry.  Neurological:     Mental Status: She is alert.     UA: +LE, +nitrites, +blood      Assessment & Plan:   Problem List Items Addressed This Visit      Genitourinary   Acute cystitis with hematuria - Primary   Relevant Medications   nitrofurantoin, macrocrystal-monohydrate, (MACROBID) 100 MG capsule   Other Relevant Orders   Urine Culture    Other Visit Diagnoses    Dysuria       Relevant Medications   nitrofurantoin, macrocrystal-monohydrate, (MACROBID) 100 MG capsule   Other Relevant Orders   POCT Urinalysis Dipstick (Automated) (Completed)   Urine Culture     Reviewed prior urine samples and concern for recurrent UTI.   UCx 12/2019 - sensitive to nitrofurantoin UCx 03/2020 - <10K units though dip positive 05/2020 - UA positive - treated and improve but no culture sent  Discussed need for culture but will treat based on symptoms and UA today. Has vaginal atrophy no clear association with intercourse.   Discussed could consider treatment of atrophy as this can decrease UTI if  she is planning this prior to urology consult.   Consider urology if culture positive but will defer to pcp.    Return if symptoms worsen or fail to improve.  Lynnda Child, MD  This visit occurred during the SARS-CoV-2 public health emergency.  Safety protocols were in place, including screening questions prior to the visit, additional usage of staff PPE, and extensive cleaning of exam room while observing appropriate contact time as indicated for disinfecting solutions.

## 2020-09-17 NOTE — Telephone Encounter (Signed)
See other telephone encounter already open.

## 2020-09-17 NOTE — Telephone Encounter (Signed)
Per Triage Call: Yvonne Hodge left voicemail on triage stating she was returning Yvonne Hodge's call in regards to something Dr. Alphonsus Sias was wanting her to try.  Please call her back at [phone].

## 2020-09-17 NOTE — Telephone Encounter (Signed)
Yvonne Hodge left voicemail on triage stating she was returning [name]'s call in regards to something Dr. Alphonsus Sias was wanting her to try.  Please call her back at [phone].

## 2020-09-17 NOTE — Addendum Note (Signed)
Addended by: Tillman Abide I on: 09/17/2020 04:52 PM   Modules accepted: Orders

## 2020-09-17 NOTE — Telephone Encounter (Signed)
Left detailed message on VM per DPR. Will wait to hear back from pt.

## 2020-09-17 NOTE — Telephone Encounter (Signed)
See note from today. Will plan to route culture results to pcp if urology referral indicated - pt noted this was discussed at their last visit in 05/2020

## 2020-09-17 NOTE — Telephone Encounter (Signed)
Spoke to pt. She said she will try the estrogen cream if it doesn't have any side effects or issues that come with taking it. Send to [facility].

## 2020-09-17 NOTE — Patient Instructions (Signed)
#   Urine infection  - will try antibiotics - will send for culture   May consider   D-Mannose supplement Treatment of Vaginal Atrophy Or urology consult

## 2020-09-17 NOTE — Telephone Encounter (Signed)
It might be worth trying twice a week estrogen cream---instead of seeing a urologist right away. (it has been shown to reduce bladder infections) See if she would like to try this

## 2020-09-17 NOTE — Telephone Encounter (Signed)
Left detailed message on Vm. Advised her to call me tomorrow if she had any questions.

## 2020-09-17 NOTE — Telephone Encounter (Signed)
Please let her know that her insurance preferred vaginal tablets that she would insert just inside twice a week. It will generally help if she has any discomfort (especially with sex) and is well tolerated. There is a very small theoretical increased risk of breast cancer (but not of dying)---but it should not be an issue if she keeps up with mammograms

## 2020-09-19 LAB — URINE CULTURE
MICRO NUMBER:: 11938725
SPECIMEN QUALITY:: ADEQUATE

## 2020-10-13 ENCOUNTER — Telehealth: Payer: Self-pay | Admitting: *Deleted

## 2020-10-13 NOTE — Telephone Encounter (Signed)
Spoke to pt. She said the breast cancer warning scares her so she would rather not start it now. She will let us know if she changes her mine.

## 2020-10-13 NOTE — Telephone Encounter (Signed)
Patient left a voicemail stating that she has decided not to take the Estrogen pill prescribed for her. Patient stated that she just wanted to let Dr. Alphonsus Sias know that.

## 2020-10-23 ENCOUNTER — Ambulatory Visit (INDEPENDENT_AMBULATORY_CARE_PROVIDER_SITE_OTHER): Payer: BC Managed Care – PPO | Admitting: Internal Medicine

## 2020-10-23 ENCOUNTER — Encounter: Payer: Self-pay | Admitting: Internal Medicine

## 2020-10-23 ENCOUNTER — Other Ambulatory Visit: Payer: Self-pay | Admitting: Internal Medicine

## 2020-10-23 ENCOUNTER — Other Ambulatory Visit: Payer: Self-pay

## 2020-10-23 VITALS — BP 132/78 | HR 78 | Temp 97.0°F | Ht 63.0 in | Wt 137.0 lb

## 2020-10-23 DIAGNOSIS — E039 Hypothyroidism, unspecified: Secondary | ICD-10-CM

## 2020-10-23 DIAGNOSIS — Z23 Encounter for immunization: Secondary | ICD-10-CM | POA: Diagnosis not present

## 2020-10-23 DIAGNOSIS — Z0000 Encounter for general adult medical examination without abnormal findings: Secondary | ICD-10-CM

## 2020-10-23 DIAGNOSIS — M818 Other osteoporosis without current pathological fracture: Secondary | ICD-10-CM | POA: Diagnosis not present

## 2020-10-23 DIAGNOSIS — N3090 Cystitis, unspecified without hematuria: Secondary | ICD-10-CM

## 2020-10-23 LAB — CBC
HCT: 39.1 % (ref 36.0–46.0)
Hemoglobin: 13.6 g/dL (ref 12.0–15.0)
MCHC: 34.8 g/dL (ref 30.0–36.0)
MCV: 88.6 fl (ref 78.0–100.0)
Platelets: 236 10*3/uL (ref 150.0–400.0)
RBC: 4.41 Mil/uL (ref 3.87–5.11)
RDW: 13.2 % (ref 11.5–15.5)
WBC: 5.7 10*3/uL (ref 4.0–10.5)

## 2020-10-23 LAB — COMPREHENSIVE METABOLIC PANEL
ALT: 15 U/L (ref 0–35)
AST: 23 U/L (ref 0–37)
Albumin: 4.3 g/dL (ref 3.5–5.2)
Alkaline Phosphatase: 73 U/L (ref 39–117)
BUN: 18 mg/dL (ref 6–23)
CO2: 29 mEq/L (ref 19–32)
Calcium: 9.4 mg/dL (ref 8.4–10.5)
Chloride: 105 mEq/L (ref 96–112)
Creatinine, Ser: 0.76 mg/dL (ref 0.40–1.20)
GFR: 85.24 mL/min (ref >60.00)
Glucose, Bld: 88 mg/dL (ref 70–99)
Potassium: 4.1 mEq/L (ref 3.5–5.1)
Sodium: 141 mEq/L (ref 135–145)
Total Bilirubin: 0.5 mg/dL (ref 0.2–1.2)
Total Protein: 6.9 g/dL (ref 6.0–8.3)

## 2020-10-23 LAB — TSH: TSH: 5.38 u[IU]/mL (ref 0.35–5.50)

## 2020-10-23 LAB — T4, FREE: Free T4: 0.62 ng/dL (ref 0.60–1.60)

## 2020-10-23 NOTE — Patient Instructions (Signed)
Please start calcium---2 tums a day would be good.

## 2020-10-23 NOTE — Progress Notes (Signed)
Subjective:    Patient ID: Yvonne Hodge, female    DOB: 1960-11-16, 60 y.o.   MRN: [id number]  HPI Here for physical This visit occurred during the SARS-CoV-2 public health emergency.  Safety protocols were in place, including screening questions prior to the visit, additional usage of staff PPE, and extensive cleaning of exam room while observing appropriate contact time as indicated for disinfecting solutions.   Having some intermittent right mid back pain Bad at times---no known injury No urinary symptoms now Heating pad may have helped  Never started the estrogen Concerned about breast cancer (mom had it) Some dyspareunia---uses lubricant (known atrophy)  Still on alendronate Not taking calcium--urged her to start  Home from work 2 days a week---in office other days  Current Outpatient Medications on File Prior to Visit  Medication Sig Dispense Refill   alendronate (FOSAMAX) 70 MG tablet Take 70 mg by mouth once a week.     levothyroxine (SYNTHROID) 25 MCG tablet TAKE 1 TABLET (25 MCG TOTAL) BY MOUTH DAILY BEFORE BREAKFAST. 90 tablet 2   Probiotic Product (PROBIOTIC PO) Take 1 capsule by mouth daily.     Vitamin D, Cholecalciferol, 50 MCG (2000 UT) CAPS Take by mouth.     promethazine (PHENERGAN) 12.5 MG tablet Take 1 tablet (12.5 mg total) by mouth every 8 (eight) hours as needed for nausea or vomiting. (Patient not taking: Reported on 10/23/2020) 20 tablet 0   No current facility-administered medications on file prior to visit.    Allergies  Allergen Reactions   Sulfonamide Derivatives Rash    Past Medical History:  Diagnosis Date   Disorder of bone and cartilage, unspecified    Thyroid disease    not on meds since Dec.2013    Past Surgical History:  Procedure Laterality Date   CESAREAN SECTION  1998   TUBAL LIGATION  1998   during C-section    Family History  Problem Relation Age of Onset   Cancer Mother        breast cancer   Hypothyroidism Mother     Alzheimer's disease Mother    Hypertension Father    Heart disease Father        atrial fib   Hypothyroidism Sister    Migraines Sister    Fibromyalgia Sister    Cancer Maternal Aunt        breast cancer   Colon cancer Neg Hx     Social History   Socioeconomic History   Marital status: Married    Spouse name: Not on file   Number of children: 2   Years of education: Not on file   Highest education level: Not on file  Occupational History   Occupation: Art gallery manager  Tobacco Use   Smoking status: Never   Smokeless tobacco: Never  Substance and Sexual Activity   Alcohol use: No   Drug use: No   Sexual activity: Not on file  Other Topics Concern   Not on file  Social History Narrative   Not on file   Social Determinants of Health   Financial Resource Strain: Not on file  Food Insecurity: Not on file  Transportation Needs: Not on file  Physical Activity: Not on file  Stress: Not on file  Social Connections: Not on file  Intimate Partner Violence: Not on file   Review of Systems  Constitutional:  Negative for fatigue and unexpected weight change.       Not really exercising---walks dog  Wears seat  belt  HENT:  Negative for hearing loss and tinnitus.        Keeps up with dentist (needs crown)  Eyes:  Negative for visual disturbance.       No diplopia or unilateral vision loss  Respiratory:  Negative for cough, chest tightness and shortness of breath.   Cardiovascular:  Negative for chest pain, palpitations and leg swelling.  Gastrointestinal:  Negative for blood in stool and constipation.       No heartburn  Endocrine: Negative for polydipsia and polyuria.  Genitourinary:  Positive for dyspareunia. Negative for dysuria and hematuria.  Musculoskeletal:  Positive for back pain.       Occ right hip pain  Skin:  Negative for rash.  Allergic/Immunologic: Positive for environmental allergies. Negative for immunocompromised state.        OTC meds prn  Neurological:  Negative for dizziness, syncope and light-headedness.       Occasional headaches----upon awakening (occ BC)  Hematological:  Negative for adenopathy. Does not bruise/bleed easily.  Psychiatric/Behavioral:  Negative for dysphoric mood and sleep disturbance. The patient is not nervous/anxious.       Objective:   Physical Exam Constitutional:      Appearance: Normal appearance.  HENT:     Right Ear: Tympanic membrane and ear canal normal.     Left Ear: Tympanic membrane and ear canal normal.     Mouth/Throat:     Pharynx: No oropharyngeal exudate or posterior oropharyngeal erythema.  Eyes:     Conjunctiva/sclera: Conjunctivae normal.     Pupils: Pupils are equal, round, and reactive to light.  Cardiovascular:     Rate and Rhythm: Normal rate and regular rhythm.     Pulses: Normal pulses.     Heart sounds: No murmur heard.   No gallop.  Pulmonary:     Effort: Pulmonary effort is normal.     Breath sounds: Normal breath sounds. No wheezing or rales.  Abdominal:     Palpations: Abdomen is soft.     Tenderness: There is no abdominal tenderness.  Musculoskeletal:     Cervical back: Neck supple.     Right lower leg: No edema.     Left lower leg: No edema.  Lymphadenopathy:     Cervical: No cervical adenopathy.  Skin:    General: Skin is warm.     Findings: No rash.  Neurological:     General: No focal deficit present.     Mental Status: She is alert and oriented to person, place, and time.  Psychiatric:        Mood and Affect: Mood normal.        Behavior: Behavior normal.           Assessment & Plan:

## 2020-10-23 NOTE — Assessment & Plan Note (Signed)
Healthy Discussed exercise Gyn does paps Yearly mammogram--had one recently Colon due 2024 shingrix today COVID booster and flu vaccine in the fall

## 2020-10-23 NOTE — Assessment & Plan Note (Signed)
She will see if it seems to be post coital (can start post coital prophylaxis) Again discussed estrogen--no, for now

## 2020-10-23 NOTE — Assessment & Plan Note (Signed)
On alendronate per gyn Needs to add calcium

## 2020-10-23 NOTE — Assessment & Plan Note (Signed)
Seems euthyroid ?Will check labs ?

## 2021-02-24 ENCOUNTER — Ambulatory Visit: Payer: BC Managed Care – PPO

## 2021-03-03 ENCOUNTER — Other Ambulatory Visit: Payer: Self-pay

## 2021-03-03 ENCOUNTER — Ambulatory Visit (INDEPENDENT_AMBULATORY_CARE_PROVIDER_SITE_OTHER): Payer: BC Managed Care – PPO

## 2021-03-03 DIAGNOSIS — Z23 Encounter for immunization: Secondary | ICD-10-CM | POA: Diagnosis not present

## 2021-03-03 NOTE — Progress Notes (Signed)
Per orders of Mayra Reel, in absence of Dr. Alphonsus Sias, 2nd injection of Shingrix given by Erby Pian. Patient tolerated injection well.

## 2021-03-31 ENCOUNTER — Other Ambulatory Visit: Payer: Self-pay

## 2021-03-31 ENCOUNTER — Ambulatory Visit
Admission: RE | Admit: 2021-03-31 | Discharge: 2021-03-31 | Disposition: A | Discharge status: Home / Self Care | Payer: BC Managed Care – PPO | Source: Ambulatory Visit | Attending: Emergency Medicine | Admitting: Emergency Medicine

## 2021-03-31 DIAGNOSIS — R030 Elevated blood-pressure reading, without diagnosis of hypertension: Secondary | ICD-10-CM

## 2021-03-31 DIAGNOSIS — B349 Viral infection, unspecified: Secondary | ICD-10-CM | POA: Diagnosis not present

## 2021-03-31 MED ORDER — BENZONATATE 100 MG PO CAPS: 100.0000 mg | ORAL_CAPSULE | Freq: Three times a day (TID) | ORAL | 0 refills | Status: DC

## 2021-03-31 NOTE — ED Triage Notes (Addendum)
Pt here with cough starting 3 days ago.

## 2021-03-31 NOTE — Discharge Instructions (Addendum)
Your COVID and Flu tests are pending.  You should self quarantine until the test results are back.  Take Tylenol or ibuprofen as needed for fever or discomfort.  Rest and keep yourself hydrated.  Follow-up with your primary care provider if your symptoms are not improving.    Your blood pressure is elevated today at 160/90; repeat 149/98.  Please have this rechecked by your primary care provider in 2-4 weeks.

## 2021-03-31 NOTE — ED Provider Notes (Signed)
Renaldo Fiddler    CSN: [id number] Arrival date & time: 03/31/21  1543      History   Chief Complaint Chief Complaint  Patient presents with   Cough    HPI Yvonne Hodge is a 60 y.o. female.  Patient presents with congestion, runny nose, postnasal drip, cough, hoarse voice x 3 days.  No fever, rash, sore throat, shortness of breath, vomiting, diarrhea, or other symptoms.  Treatment attempted at home with OTC cold and flu medication.  Her medical history includes thyroid disease and osteoporosis.   The history is provided by the patient and medical records.   Past Medical History:  Diagnosis Date   Disorder of bone and cartilage, unspecified    Thyroid disease    not on meds since Dec.2013    Patient Active Problem List   Diagnosis Date Noted   Acute low back pain without sciatica 04/22/2020   Recurrent cystitis 12/26/2019   Preventative health care 10/22/2018   Hypothyroidism 01/09/2007   Osteoporosis 01/09/2007    Past Surgical History:  Procedure Laterality Date   CESAREAN SECTION  1998   TUBAL LIGATION  1998   during C-section    OB History   No obstetric history on file.      Home Medications    Prior to Admission medications   Medication Sig Start Date End Date Taking? Authorizing Provider  benzonatate (TESSALON) 100 MG capsule Take 1 capsule (100 mg total) by mouth every 8 (eight) hours. 03/31/21  Yes Mickie Bail, NP  alendronate (FOSAMAX) 70 MG tablet Take 70 mg by mouth once a week. 08/28/19   [provider]  levothyroxine (SYNTHROID) 25 MCG tablet TAKE 1 TABLET (25 MCG TOTAL) BY MOUTH DAILY BEFORE BREAKFAST. 10/27/20   Karie Schwalbe, MD  Probiotic Product (PROBIOTIC PO) Take 1 capsule by mouth daily.    [provider]  promethazine (PHENERGAN) 12.5 MG tablet Take 1 tablet (12.5 mg total) by mouth every 8 (eight) hours as needed for nausea or vomiting. Patient not taking: Reported on 10/23/2020 06/26/20   Karie Schwalbe, MD   Vitamin D, Cholecalciferol, 50 MCG (2000 UT) CAPS Take by mouth.    [provider]    Family History Family History  Problem Relation Age of Onset   Cancer Mother        breast cancer   Hypothyroidism Mother    Alzheimer's disease Mother    Hypertension Father    Heart disease Father        atrial fib   Hypothyroidism Sister    Migraines Sister    Fibromyalgia Sister    Cancer Maternal Aunt        breast cancer   Colon cancer Neg Hx     Social History Social History   Tobacco Use   Smoking status: Never   Smokeless tobacco: Never  Substance Use Topics   Alcohol use: No   Drug use: No     Allergies   Sulfonamide derivatives   Review of Systems Review of Systems  Constitutional:  Negative for chills and fever.  HENT:  Positive for congestion, postnasal drip, rhinorrhea and voice change. Negative for ear pain, sore throat and trouble swallowing.   Respiratory:  Positive for cough. Negative for shortness of breath.   Cardiovascular:  Negative for chest pain and palpitations.  Gastrointestinal:  Negative for diarrhea and vomiting.  Skin:  Negative for color change and rash.  All other systems reviewed and are negative.  Physical Exam Triage Vital Signs ED Triage Vitals  Enc Vitals Group     BP      Pulse      Resp      Temp      Temp src      SpO2      Weight      Height      Head Circumference      Peak Flow      Pain Score      Pain Loc      Pain Edu?      Excl. in GC?    No data found.  Updated Vital Signs BP (!) 149/98   Pulse 96   Temp 98.9 F (37.2 C) (Oral)   Resp 18   LMP 05/26/2014   SpO2 96%   Visual Acuity Right Eye Distance:   Left Eye Distance:   Bilateral Distance:    Right Eye Near:   Left Eye Near:    Bilateral Near:     Physical Exam Vitals and nursing note reviewed.  Constitutional:      General: She is not in acute distress.    Appearance: She is well-developed.  HENT:     Right Ear: Tympanic  membrane normal.     Left Ear: Tympanic membrane normal.     Nose: Rhinorrhea present.     Mouth/Throat:     Mouth: Mucous membranes are moist.     Pharynx: Oropharynx is clear.  Cardiovascular:     Rate and Rhythm: Normal rate and regular rhythm.     Heart sounds: Normal heart sounds. No murmur heard. Pulmonary:     Effort: Pulmonary effort is normal. No respiratory distress.     Breath sounds: Normal breath sounds.  Musculoskeletal:     Cervical back: Neck supple.  Skin:    General: Skin is warm and dry.  Neurological:     Mental Status: She is alert.  Psychiatric:        Mood and Affect: Mood normal.        Behavior: Behavior normal.     UC Treatments / Results  Labs (all labs ordered are listed, but only abnormal results are displayed) Labs Reviewed  COVID-19, FLU A+B NAA    EKG   Radiology No results found.  Procedures Procedures (including critical care time)  Medications Ordered in UC Medications - No data to display  Initial Impression / Assessment and Plan / UC Course  I have reviewed the triage vital signs and the nursing notes.  Pertinent labs & imaging results that were available during my care of the patient were reviewed by me and considered in my medical decision making (see chart for details).    Viral illness, elevated blood pressure reading.  COVID and Flu pending.  Instructed patient to self quarantine per CDC guidelines.  Treating cough with Tessalon Perles.  Discussed symptomatic treatment including Tylenol or ibuprofen, rest, hydration.  Instructed patient to follow up with PCP if symptoms are not improving.  Discussed that her blood pressure is elevated today and needs to be rechecked by her PCP in 2 weeks.  Patient agrees to plan of care.    Final Clinical Impressions(s) / UC Diagnoses   Final diagnoses:  Viral illness  Elevated blood pressure reading     Discharge Instructions      Your COVID and Flu tests are pending.  You  should self quarantine until the test results are back.  Take Tylenol or  ibuprofen as needed for fever or discomfort.  Rest and keep yourself hydrated.  Follow-up with your primary care provider if your symptoms are not improving.    Your blood pressure is elevated today at 160/90; repeat 149/98.  Please have this rechecked by your primary care provider in 2-4 weeks.          ED Prescriptions     Medication Sig Dispense Auth. Provider   benzonatate (TESSALON) 100 MG capsule Take 1 capsule (100 mg total) by mouth every 8 (eight) hours. 21 capsule Mickie Bail, NP      PDMP not reviewed this encounter.   Mickie Bail, NP 03/31/21 1705

## 2021-04-01 LAB — COVID-19, FLU A+B NAA
Influenza A, NAA: NOT DETECTED
Influenza B, NAA: NOT DETECTED
SARS-CoV-2, NAA: NOT DETECTED

## 2021-09-08 ENCOUNTER — Encounter: Payer: Self-pay | Admitting: Internal Medicine

## 2021-09-08 ENCOUNTER — Ambulatory Visit: Payer: BC Managed Care – PPO | Admitting: Internal Medicine

## 2021-09-08 DIAGNOSIS — R030 Elevated blood-pressure reading, without diagnosis of hypertension: Secondary | ICD-10-CM | POA: Diagnosis not present

## 2021-09-08 DIAGNOSIS — H18821 Corneal disorder due to contact lens, right eye: Secondary | ICD-10-CM | POA: Diagnosis not present

## 2021-09-08 NOTE — Patient Instructions (Signed)

## 2021-09-08 NOTE — Progress Notes (Signed)
? ?  Subjective:  ? ? Patient ID: Yvonne Hodge, female    DOB: Mar 05, 1961, 61 y.o.   MRN: [id number] ? ?HPI ?Here due to elevated blood pressure ? ?Checking BP lately ?Diastolic numbers in the 90's ?Has noticed some ankle swelling also ?Reviewed her BP technique---doing it right ? ?No chest pain ?No SOB ?No palpitations ?Slight lightheaded feeling--nothing close to syncope ? ?Current Outpatient Medications on File Prior to Visit  ?Medication Sig Dispense Refill  ? alendronate (FOSAMAX) 70 MG tablet Take 70 mg by mouth once a week.    ? CALCIUM PO Take by mouth.    ? levothyroxine (SYNTHROID) 25 MCG tablet TAKE 1 TABLET (25 MCG TOTAL) BY MOUTH DAILY BEFORE BREAKFAST. 90 tablet 3  ? Probiotic Product (PROBIOTIC PO) Take 1 capsule by mouth daily.    ? Vitamin D, Cholecalciferol, 50 MCG (2000 UT) CAPS Take by mouth.    ? ?No current facility-administered medications on file prior to visit.  ? ? ?Allergies  ?Allergen Reactions  ? Sulfonamide Derivatives Rash  ? ? ?Past Medical History:  ?Diagnosis Date  ? Disorder of bone and cartilage, unspecified   ? Thyroid disease   ? not on meds since Dec.2013  ? ? ?Past Surgical History:  ?Procedure Laterality Date  ? CESAREAN SECTION  1998  ? TUBAL LIGATION  1998  ? during C-section  ? ? ?Family History  ?Problem Relation Age of Onset  ? Cancer Mother   ?     breast cancer  ? Hypothyroidism Mother   ? Alzheimer's disease Mother   ? Hypertension Father   ? Heart disease Father   ?     atrial fib  ? Hypothyroidism Sister   ? Migraines Sister   ? Fibromyalgia Sister   ? Cancer Maternal Aunt   ?     breast cancer  ? Colon cancer Neg Hx   ? ? ?Social History  ? ?Socioeconomic History  ? Marital status: Married  ?  Spouse name: Not on file  ? Number of children: 2  ? Years of education: Not on file  ? Highest education level: Not on file  ?Occupational History  ? Occupation: Art gallery manager  ?Tobacco Use  ? Smoking status: Never  ? Smokeless tobacco: Never   ?Substance and Sexual Activity  ? Alcohol use: No  ? Drug use: No  ? Sexual activity: Not on file  ?Other Topics Concern  ? Not on file  ?Social History Narrative  ? Not on file  ? ?Social Determinants of Health  ? ?Financial Resource Strain: Not on file  ?Food Insecurity: Not on file  ?Transportation Needs: Not on file  ?Physical Activity: Not on file  ?Stress: Not on file  ?Social Connections: Not on file  ?Intimate Partner Violence: Not on file  ? ?Review of Systems ?Usually sleeps okay--uses occasional tylenol PM ?Some right hip pain ?Uses some salt regularly ?   ?Objective:  ? Physical Exam  ? ? ? ? ?   ?Assessment & Plan:  ? ?

## 2021-09-08 NOTE — Progress Notes (Signed)
? ?Subjective:  ? ? Patient ID: Yvonne Hodge, female    DOB: Aug 26, 1960, 61 y.o.   MRN: [id number] ? ?HPI ?Here due to elevated BP ?Has had a week of elevated BP---systolic up to 142 ?Diastolic up to 97 ? ?Has taken it at rest ?No chest pain ?No SOB ?Slight lightheaded feeling at times----but not near syncope ? ?Walks some---with dog ?Does use salt ? ?Current Outpatient Medications on File Prior to Visit  ?Medication Sig Dispense Refill  ? alendronate (FOSAMAX) 70 MG tablet Take 70 mg by mouth once a week.    ? CALCIUM PO Take by mouth.    ? levothyroxine (SYNTHROID) 25 MCG tablet TAKE 1 TABLET (25 MCG TOTAL) BY MOUTH DAILY BEFORE BREAKFAST. 90 tablet 3  ? Probiotic Product (PROBIOTIC PO) Take 1 capsule by mouth daily.    ? Vitamin D, Cholecalciferol, 50 MCG (2000 UT) CAPS Take by mouth.    ? ?No current facility-administered medications on file prior to visit.  ? ? ?Allergies  ?Allergen Reactions  ? Sulfonamide Derivatives Rash  ? ? ?Past Medical History:  ?Diagnosis Date  ? Disorder of bone and cartilage, unspecified   ? Thyroid disease   ? not on meds since Dec.2013  ? ? ?Past Surgical History:  ?Procedure Laterality Date  ? CESAREAN SECTION  1998  ? TUBAL LIGATION  1998  ? during C-section  ? ? ?Family History  ?Problem Relation Age of Onset  ? Cancer Mother   ?     breast cancer  ? Hypothyroidism Mother   ? Alzheimer's disease Mother   ? Hypertension Father   ? Heart disease Father   ?     atrial fib  ? Hypothyroidism Sister   ? Migraines Sister   ? Fibromyalgia Sister   ? Cancer Maternal Aunt   ?     breast cancer  ? Colon cancer Neg Hx   ? ? ?Social History  ? ?Socioeconomic History  ? Marital status: Married  ?  Spouse name: Not on file  ? Number of children: 2  ? Years of education: Not on file  ? Highest education level: Not on file  ?Occupational History  ? Occupation: Art gallery manager  ?Tobacco Use  ? Smoking status: Never  ? Smokeless tobacco: Never  ?Substance and Sexual  Activity  ? Alcohol use: No  ? Drug use: No  ? Sexual activity: Not on file  ?Other Topics Concern  ? Not on file  ?Social History Narrative  ? Not on file  ? ?Social Determinants of Health  ? ?Financial Resource Strain: Not on file  ?Food Insecurity: Not on file  ?Transportation Needs: Not on file  ?Physical Activity: Not on file  ?Stress: Not on file  ?Social Connections: Not on file  ?Intimate Partner Violence: Not on file  ? ?Review of Systems ?Generally sleeps okay--uses tylenol PM at times ?Some right hip pain ?Occasional BCs for headache--one yesterday ? ?   ?Objective:  ? Physical Exam ?Constitutional:   ?   Appearance: Normal appearance.  ?Cardiovascular:  ?   Rate and Rhythm: Normal rate and regular rhythm.  ?   Heart sounds: No murmur heard. ?  No gallop.  ?Pulmonary:  ?   Effort: Pulmonary effort is normal.  ?   Breath sounds: Normal breath sounds. No wheezing or rales.  ?Musculoskeletal:  ?   Cervical back: Neck supple.  ?   Right lower leg: No edema.  ?   Left lower  leg: No edema.  ?Lymphadenopathy:  ?   Cervical: No cervical adenopathy.  ?Neurological:  ?   Mental Status: She is alert.  ?  ? ? ? ? ?   ?Assessment & Plan:  ? ?

## 2021-09-08 NOTE — Assessment & Plan Note (Signed)
BP Readings from Last 3 Encounters:  ?09/08/21 140/88  ?03/31/21 (!) 149/98  ?10/23/20 132/78  ? ?Borderline at home ?Mild dependent edema by description ?Discussed trial maxzide 25 vs behavioral measures---will hold off on meds ?Cut back on salt ?DASH eating ?Increase walkin ?

## 2021-09-16 DIAGNOSIS — Z13820 Encounter for screening for osteoporosis: Secondary | ICD-10-CM | POA: Diagnosis not present

## 2021-09-16 DIAGNOSIS — Z1231 Encounter for screening mammogram for malignant neoplasm of breast: Secondary | ICD-10-CM | POA: Diagnosis not present

## 2021-10-04 DIAGNOSIS — Z01419 Encounter for gynecological examination (general) (routine) without abnormal findings: Secondary | ICD-10-CM | POA: Diagnosis not present

## 2021-10-04 DIAGNOSIS — Z6825 Body mass index (BMI) 25.0-25.9, adult: Secondary | ICD-10-CM | POA: Diagnosis not present

## 2021-10-29 ENCOUNTER — Encounter: Payer: Self-pay | Admitting: Internal Medicine

## 2021-10-29 ENCOUNTER — Ambulatory Visit (INDEPENDENT_AMBULATORY_CARE_PROVIDER_SITE_OTHER): Payer: BC Managed Care – PPO | Admitting: Internal Medicine

## 2021-10-29 VITALS — BP 124/80 | HR 77 | Ht 63.0 in | Wt 140.0 lb

## 2021-10-29 DIAGNOSIS — E039 Hypothyroidism, unspecified: Secondary | ICD-10-CM

## 2021-10-29 DIAGNOSIS — Z0000 Encounter for general adult medical examination without abnormal findings: Secondary | ICD-10-CM | POA: Diagnosis not present

## 2021-10-29 DIAGNOSIS — M810 Age-related osteoporosis without current pathological fracture: Secondary | ICD-10-CM

## 2021-10-29 DIAGNOSIS — Z23 Encounter for immunization: Secondary | ICD-10-CM | POA: Diagnosis not present

## 2021-10-29 LAB — T4, FREE: Free T4: 0.63 ng/dL (ref 0.60–1.60)

## 2021-10-29 LAB — COMPREHENSIVE METABOLIC PANEL
ALT: 33 U/L (ref 0–35)
AST: 34 U/L (ref 0–37)
Albumin: 4.4 g/dL (ref 3.5–5.2)
Alkaline Phosphatase: 104 U/L (ref 39–117)
BUN: 12 mg/dL (ref 6–23)
CO2: 27 mEq/L (ref 19–32)
Calcium: 9.2 mg/dL (ref 8.4–10.5)
Chloride: 103 mEq/L (ref 96–112)
Creatinine, Ser: 0.63 mg/dL (ref 0.40–1.20)
GFR: 95.82 mL/min (ref >60.00)
Glucose, Bld: 96 mg/dL (ref 70–99)
Potassium: 3.7 mEq/L (ref 3.5–5.1)
Sodium: 138 mEq/L (ref 135–145)
Total Bilirubin: 0.5 mg/dL (ref 0.2–1.2)
Total Protein: 6.9 g/dL (ref 6.0–8.3)

## 2021-10-29 LAB — LIPID PANEL
Cholesterol: 170 mg/dL (ref 0–200)
HDL: 60.1 mg/dL (ref >39.00)
LDL Cholesterol: 94 mg/dL (ref 0–99)
NonHDL: 109.44
Total CHOL/HDL Ratio: 3
Triglycerides: 75 mg/dL (ref 0.0–149.0)
VLDL: 15 mg/dL (ref 0.0–40.0)

## 2021-10-29 LAB — CBC
HCT: 38.1 % (ref 36.0–46.0)
Hemoglobin: 12.8 g/dL (ref 12.0–15.0)
MCHC: 33.6 g/dL (ref 30.0–36.0)
MCV: 91.6 fl (ref 78.0–100.0)
Platelets: 247 10*3/uL (ref 150.0–400.0)
RBC: 4.16 Mil/uL (ref 3.87–5.11)
RDW: 13.1 % (ref 11.5–15.5)
WBC: 7.2 10*3/uL (ref 4.0–10.5)

## 2021-10-29 LAB — TSH: TSH: 3.67 u[IU]/mL (ref 0.35–5.50)

## 2021-10-29 NOTE — Progress Notes (Signed)
Subjective:    Patient ID: Yvonne Hodge, female    DOB: 01-13-1961, 61 y.o.   MRN: [id number]  HPI Here for physical  Has been monitoring her BP at home 120-130's/80's Has been trying to walk at work and with the dog Has cut back on salt  Had some recent dental work On amoxicillin---and had some lower abdominal cramps (related to this---but very brief) No recent UTIs Did improve after moving her bowels  Current Outpatient Medications on File Prior to Visit  Medication Sig Dispense Refill   alendronate (FOSAMAX) 70 MG tablet Take 70 mg by mouth once a week.     levothyroxine (SYNTHROID) 25 MCG tablet TAKE 1 TABLET (25 MCG TOTAL) BY MOUTH DAILY BEFORE BREAKFAST. 90 tablet 3   Multiple Vitamin (MULTI-VITAMIN) tablet Take 1 tablet by mouth daily.     Probiotic Product (PROBIOTIC PO) Take 1 capsule by mouth daily.     No current facility-administered medications on file prior to visit.    Allergies  Allergen Reactions   Misc. Sulfonamide Containing Compounds Rash   Sulfonamide Derivatives Rash    Past Medical History:  Diagnosis Date   Disorder of bone and cartilage, unspecified    Thyroid disease    not on meds since Dec.2013    Past Surgical History:  Procedure Laterality Date   CESAREAN SECTION  1998   TUBAL LIGATION  1998   during C-section    Family History  Problem Relation Age of Onset   Cancer Mother        breast cancer   Hypothyroidism Mother    Alzheimer's disease Mother    Hypertension Father    Heart disease Father        atrial fib   Hypothyroidism Sister    Migraines Sister    Fibromyalgia Sister    Cancer Maternal Aunt        breast cancer   Colon cancer Neg Hx     Social History   Socioeconomic History   Marital status: Married    Spouse name: Not on file   Number of children: 2   Years of education: Not on file   Highest education level: Not on file  Occupational History   Occupation: Art gallery manager   Tobacco Use   Smoking status: Never   Smokeless tobacco: Never  Vaping Use   Vaping Use: Never used  Substance and Sexual Activity   Alcohol use: No   Drug use: No   Sexual activity: Not on file  Other Topics Concern   Not on file  Social History Narrative   Not on file   Social Determinants of Health   Financial Resource Strain: Not on file  Food Insecurity: Not on file  Transportation Needs: Not on file  Physical Activity: Not on file  Stress: Not on file  Social Connections: Not on file  Intimate Partner Violence: Not on file   Review of Systems  Constitutional:  Negative for fatigue and unexpected weight change.       Wears seat belt  HENT:  Negative for hearing loss and tinnitus.        Recent dental work  Eyes:  Negative for visual disturbance.       No diplopia or unilateral vision loss  Gastrointestinal:  Negative for blood in stool and constipation.       No heartburn  Endocrine: Negative for polydipsia and polyuria.  Genitourinary:  Positive for dyspareunia. Negative for dysuria and hematuria.  Uses lubricant for sex  Musculoskeletal:  Negative for back pain and joint swelling.       Some feet and joint pains. Did go to [employer]  Skin:  Negative for rash.       No suspicious lesions---lots of nevi (sees derm yearly)  Allergic/Immunologic: Positive for environmental allergies. Negative for immunocompromised state.       Rare OTC allergy meds  Neurological:  Negative for dizziness, syncope and light-headedness.       Occ AM headache--relates to pillow  Hematological:  Negative for adenopathy. Does not bruise/bleed easily.  Psychiatric/Behavioral:  Negative for dysphoric mood and sleep disturbance. The patient is not nervous/anxious.        Objective:   Physical Exam Constitutional:      Appearance: Normal appearance.  HENT:     Mouth/Throat:     Pharynx: No oropharyngeal exudate or posterior oropharyngeal erythema.  Eyes:      Conjunctiva/sclera: Conjunctivae normal.     Pupils: Pupils are equal, round, and reactive to light.  Cardiovascular:     Rate and Rhythm: Normal rate and regular rhythm.     Pulses: Normal pulses.     Heart sounds: No murmur heard.    No gallop.  Pulmonary:     Effort: Pulmonary effort is normal.     Breath sounds: Normal breath sounds. No wheezing or rales.  Abdominal:     Palpations: Abdomen is soft.     Tenderness: There is no abdominal tenderness.  Musculoskeletal:     Cervical back: Neck supple.     Right lower leg: No edema.     Left lower leg: No edema.  Lymphadenopathy:     Cervical: No cervical adenopathy.  Skin:    Findings: No lesion or rash.  Neurological:     General: No focal deficit present.     Mental Status: She is alert and oriented to person, place, and time.  Psychiatric:        Mood and Affect: Mood normal.        Behavior: Behavior normal.            Assessment & Plan:

## 2021-10-29 NOTE — Assessment & Plan Note (Signed)
Seems to be euthyroid on levothyroxine 25

## 2021-10-29 NOTE — Assessment & Plan Note (Signed)
2 years into the alendronate May only take 1 more year per gyn

## 2021-10-29 NOTE — Addendum Note (Signed)
Addended by: Winn Jock on: 10/29/2021 09:51 AM   Modules accepted: Orders

## 2021-10-29 NOTE — Assessment & Plan Note (Signed)
Healthy Has cut salt and continues to exercise ---BP better Colon due next year Had mammogram this year at Gyn--and pap due next year Will update Td today Updated COVID and flu vaccines in the fall

## 2021-11-13 ENCOUNTER — Other Ambulatory Visit: Payer: Self-pay | Admitting: Internal Medicine

## 2021-11-19 DIAGNOSIS — D1801 Hemangioma of skin and subcutaneous tissue: Secondary | ICD-10-CM | POA: Diagnosis not present

## 2021-11-19 DIAGNOSIS — D2271 Melanocytic nevi of right lower limb, including hip: Secondary | ICD-10-CM | POA: Diagnosis not present

## 2021-11-19 DIAGNOSIS — D225 Melanocytic nevi of trunk: Secondary | ICD-10-CM | POA: Diagnosis not present

## 2021-11-19 DIAGNOSIS — L814 Other melanin hyperpigmentation: Secondary | ICD-10-CM | POA: Diagnosis not present

## 2021-11-19 DIAGNOSIS — D2272 Melanocytic nevi of left lower limb, including hip: Secondary | ICD-10-CM | POA: Diagnosis not present

## 2022-03-22 ENCOUNTER — Ambulatory Visit
Admission: EM | Admit: 2022-03-22 | Discharge: 2022-03-22 | Disposition: A | Discharge status: Home / Self Care | Payer: BC Managed Care – PPO | Attending: Emergency Medicine | Admitting: Emergency Medicine

## 2022-03-22 DIAGNOSIS — M545 Low back pain, unspecified: Secondary | ICD-10-CM

## 2022-03-22 LAB — POCT URINALYSIS DIP (MANUAL ENTRY)
Bilirubin, UA: NEGATIVE
Blood, UA: NEGATIVE
Glucose, UA: NEGATIVE mg/dL
Ketones, POC UA: NEGATIVE mg/dL
Leukocytes, UA: NEGATIVE
Nitrite, UA: NEGATIVE
Protein Ur, POC: NEGATIVE mg/dL
Spec Grav, UA: 1.02 (ref 1.010–1.025)
Urobilinogen, UA: 0.2 E.U./dL
pH, UA: 6 (ref 5.0–8.0)

## 2022-03-22 MED ORDER — CYCLOBENZAPRINE HCL 10 MG PO TABS: 10.0000 mg | ORAL_TABLET | Freq: Two times a day (BID) | ORAL | 0 refills | Status: DC | PRN

## 2022-03-22 NOTE — Discharge Instructions (Addendum)
Take ibuprofen as needed for discomfort.  Take the muscle relaxer as needed for muscle spasm; Do not drive, operate machinery, or drink alcohol with this medication as it can cause drowsiness.   Follow up with your primary care provider or an orthopedist if your symptoms are not improving.     

## 2022-03-22 NOTE — ED Provider Notes (Signed)
Renaldo Fiddler    CSN: [id number] Arrival date & time: 03/22/22  8871      History   Chief Complaint Chief Complaint  Patient presents with   Back Pain    HPI Yvonne Hodge is a 61 y.o. female.  Patient presents with right lower back pain x 2 days.  No falls or injury.  The pain is nonradiating; worse with twisting and position changes; improves with rest.  Treatment at home with [facility] powder taken at 0400 this morning.  She denies numbness, weakness, paresthesias, saddle anesthesia, loss of bowel/bladder control, abdominal pain, dysuria, hematuria, rash, or other symptoms.    The history is provided by the patient and medical records.    Past Medical History:  Diagnosis Date   Disorder of bone and cartilage, unspecified    Thyroid disease    not on meds since Dec.2013    Patient Active Problem List   Diagnosis Date Noted   Elevated blood pressure reading without diagnosis of hypertension 09/08/2021   Acute low back pain without sciatica 04/22/2020   Recurrent cystitis 12/26/2019   Preventative health care 10/22/2018   Hypothyroidism 01/09/2007   Osteoporosis 01/09/2007    Past Surgical History:  Procedure Laterality Date   CESAREAN SECTION  1998   TUBAL LIGATION  1998   during C-section    OB History   No obstetric history on file.      Home Medications    Prior to Admission medications   Medication Sig Start Date End Date Taking? Authorizing Provider  alendronate (FOSAMAX) 70 MG tablet Take 70 mg by mouth once a week. 08/28/19  Yes [provider]  cyclobenzaprine (FLEXERIL) 10 MG tablet Take 1 tablet (10 mg total) by mouth 2 (two) times daily as needed for muscle spasms. 03/22/22  Yes Mickie Bail, NP  levothyroxine (SYNTHROID) 25 MCG tablet TAKE 1 TABLET BY MOUTH DAILY BEFORE BREAKFAST. 11/15/21  Yes Karie Schwalbe, MD  Multiple Vitamin (MULTI-VITAMIN) tablet Take 1 tablet by mouth daily.   Yes [provider]  Probiotic Product  (PROBIOTIC PO) Take 1 capsule by mouth daily.   Yes [provider]    Family History Family History  Problem Relation Age of Onset   Cancer Mother        breast cancer   Hypothyroidism Mother    Alzheimer's disease Mother    Hypertension Father    Heart disease Father        atrial fib   Hypothyroidism Sister    Migraines Sister    Fibromyalgia Sister    Cancer Maternal Aunt        breast cancer   Colon cancer Neg Hx     Social History Social History   Tobacco Use   Smoking status: Never   Smokeless tobacco: Never  Vaping Use   Vaping Use: Never used  Substance Use Topics   Alcohol use: No   Drug use: No     Allergies   Misc. sulfonamide containing compounds and Sulfonamide derivatives   Review of Systems Review of Systems  Constitutional:  Negative for chills and fever.  Gastrointestinal:  Negative for abdominal pain, constipation, diarrhea, nausea and vomiting.  Genitourinary:  Negative for dysuria and hematuria.  Musculoskeletal:  Positive for back pain. Negative for arthralgias, gait problem and joint swelling.  Skin:  Negative for color change, rash and wound.  Neurological:  Negative for weakness and numbness.  All other systems reviewed and are negative.  Physical Exam Triage Vital Signs ED Triage Vitals  Enc Vitals Group     BP      Pulse      Resp      Temp      Temp src      SpO2      Weight      Height      Head Circumference      Peak Flow      Pain Score      Pain Loc      Pain Edu?      Excl. in GC?    No data found.  Updated Vital Signs BP (!) 149/92 (BP Location: Left Arm)   Pulse 78   Temp 98.8 F (37.1 C) (Oral)   Resp 16   LMP 05/26/2014   SpO2 98%   Visual Acuity Right Eye Distance:   Left Eye Distance:   Bilateral Distance:    Right Eye Near:   Left Eye Near:    Bilateral Near:     Physical Exam Vitals and nursing note reviewed.  Constitutional:      General: She is not in acute distress.     Appearance: Normal appearance. She is well-developed. She is not ill-appearing.  HENT:     Mouth/Throat:     Mouth: Mucous membranes are moist.  Cardiovascular:     Rate and Rhythm: Normal rate and regular rhythm.     Heart sounds: Normal heart sounds.  Pulmonary:     Effort: Pulmonary effort is normal. No respiratory distress.     Breath sounds: Normal breath sounds.  Abdominal:     General: Bowel sounds are normal.     Palpations: Abdomen is soft.     Tenderness: There is no abdominal tenderness. There is no right CVA tenderness, left CVA tenderness, guarding or rebound.  Musculoskeletal:        General: No swelling, tenderness, deformity or signs of injury. Normal range of motion.     Cervical back: Neck supple.       Back:  Skin:    General: Skin is warm and dry.     Capillary Refill: Capillary refill takes less than 2 seconds.     Findings: No bruising, erythema, lesion or rash.  Neurological:     General: No focal deficit present.     Mental Status: She is alert and oriented to person, place, and time.     Sensory: No sensory deficit.     Motor: No weakness.     Gait: Gait normal.  Psychiatric:        Mood and Affect: Mood normal.        Behavior: Behavior normal.      UC Treatments / Results  Labs (all labs ordered are listed, but only abnormal results are displayed) Labs Reviewed  POCT URINALYSIS DIP (MANUAL ENTRY)    EKG   Radiology No results found.  Procedures Procedures (including critical care time)  Medications Ordered in UC Medications - No data to display  Initial Impression / Assessment and Plan / UC Course  I have reviewed the triage vital signs and the nursing notes.  Pertinent labs & imaging results that were available during my care of the patient were reviewed by me and considered in my medical decision making (see chart for details).    Right lower back pain without sciatica.  Treating with Flexeril; precautions for drowsiness with  this medication discussed.  Instructed patient to take ibuprofen  also for her discomfort.  Instructed her to follow-up with her PCP or an orthopedist if her symptoms are not improving.  Education provided on acute back pain.  Contact information for on-call Ortho provided.  Patient agrees to plan of care.  Final Clinical Impressions(s) / UC Diagnoses   Final diagnoses:  Acute right-sided low back pain without sciatica     Discharge Instructions      Take ibuprofen as needed for discomfort.  Take the muscle relaxer as needed for muscle spasm; Do not drive, operate machinery, or drink alcohol with this medication as it can cause drowsiness.   Follow up with your primary care provider or an orthopedist if your symptoms are not improving.         ED Prescriptions     Medication Sig Dispense Auth. Provider   cyclobenzaprine (FLEXERIL) 10 MG tablet Take 1 tablet (10 mg total) by mouth 2 (two) times daily as needed for muscle spasms. 20 tablet Mickie Bail, NP      I have reviewed the PDMP during this encounter.   Mickie Bail, NP 03/22/22 1022

## 2022-03-22 NOTE — ED Triage Notes (Signed)
Pt c/o right lower back pain that increases with movement. Denies UTI sx.

## 2022-03-24 ENCOUNTER — Ambulatory Visit: Payer: BC Managed Care – PPO | Admitting: Internal Medicine

## 2022-07-06 ENCOUNTER — Encounter: Payer: Self-pay | Admitting: Internal Medicine

## 2022-07-19 ENCOUNTER — Encounter: Payer: Self-pay | Admitting: Internal Medicine

## 2022-09-01 ENCOUNTER — Ambulatory Visit (INDEPENDENT_AMBULATORY_CARE_PROVIDER_SITE_OTHER): Payer: BC Managed Care – PPO | Admitting: Internal Medicine

## 2022-09-01 ENCOUNTER — Encounter: Payer: Self-pay | Admitting: Internal Medicine

## 2022-09-01 VITALS — BP 130/70 | HR 75 | Temp 97.3°F | Ht 63.0 in | Wt 143.0 lb

## 2022-09-01 DIAGNOSIS — R109 Unspecified abdominal pain: Secondary | ICD-10-CM | POA: Diagnosis not present

## 2022-09-01 LAB — POC URINALSYSI DIPSTICK (AUTOMATED)
Bilirubin, UA: NEGATIVE
Blood, UA: NEGATIVE
Glucose, UA: NEGATIVE
Ketones, UA: NEGATIVE
Nitrite, UA: NEGATIVE
Protein, UA: POSITIVE — AB
Spec Grav, UA: 1.025 (ref 1.010–1.025)
Urobilinogen, UA: 0.2 E.U./dL
pH, UA: 6 (ref 5.0–8.0)

## 2022-09-01 MED ORDER — CYCLOBENZAPRINE HCL 10 MG PO TABS: 10.0000 mg | ORAL_TABLET | Freq: Every evening | ORAL | 0 refills | Status: DC | PRN

## 2022-09-01 MED ORDER — NITROFURANTOIN MONOHYD MACRO 100 MG PO CAPS: 100.0000 mg | ORAL_CAPSULE | Freq: Two times a day (BID) | ORAL | 0 refills | Status: DC

## 2022-09-01 NOTE — Assessment & Plan Note (Signed)
Seems likely muscular---since positional and heat/flexeril helped Urinalysis shows 3+ leuks though. Will send culture and try empiric Rx with macrobid for 3 days Doubt stone--but that is possible (will see if pain moves, etc) Will refill the cyclobenzaprine Continue heat. Try 2 aleve bid

## 2022-09-01 NOTE — Progress Notes (Signed)
Subjective:    Patient ID: Yvonne Hodge, female    DOB: 06/24/1960, 62 y.o.   MRN: [id number]  HPI Here due to left low back pain  Started earlier this week  Got really bad last night No radiation No new tasks or injury  No urinary pain--no blood ?slight urgency once--nothing striking  Pain is constant---may worsen with certain movements Heating pad helped  1 aleve this morning didn't help Cyclobenzaprine helped her sleep  Current Outpatient Medications on File Prior to Visit  Medication Sig Dispense Refill   alendronate (FOSAMAX) 70 MG tablet Take 70 mg by mouth once a week.     levothyroxine (SYNTHROID) 25 MCG tablet TAKE 1 TABLET BY MOUTH DAILY BEFORE BREAKFAST. 90 tablet 3   Multiple Vitamin (MULTI-VITAMIN) tablet Take 1 tablet by mouth daily.     Probiotic Product (PROBIOTIC PO) Take 1 capsule by mouth daily.     cyclobenzaprine (FLEXERIL) 10 MG tablet Take 1 tablet (10 mg total) by mouth 2 (two) times daily as needed for muscle spasms. (Patient not taking: Reported on 09/01/2022) 20 tablet 0   No current facility-administered medications on file prior to visit.    Allergies  Allergen Reactions   Misc. Sulfonamide Containing Compounds Rash   Sulfonamide Derivatives Rash    Past Medical History:  Diagnosis Date   Disorder of bone and cartilage, unspecified    Thyroid disease    not on meds since Dec.2013    Past Surgical History:  Procedure Laterality Date   CESAREAN SECTION  1998   TUBAL LIGATION  1998   during C-section    Family History  Problem Relation Age of Onset   Cancer Mother        breast cancer   Hypothyroidism Mother    Alzheimer's disease Mother    Hypertension Father    Heart disease Father        atrial fib   Hypothyroidism Sister    Migraines Sister    Fibromyalgia Sister    Cancer Maternal Aunt        breast cancer   Colon cancer Neg Hx     Social History   Socioeconomic History   Marital status: Married    Spouse name: Not  on file   Number of children: 2   Years of education: Not on file   Highest education level: Not on file  Occupational History   Occupation: Art gallery manager  Tobacco Use   Smoking status: Never   Smokeless tobacco: Never  Vaping Use   Vaping Use: Never used  Substance and Sexual Activity   Alcohol use: No   Drug use: No   Sexual activity: Not on file  Other Topics Concern   Not on file  Social History Narrative   Not on file   Social Determinants of Health   Financial Resource Strain: Not on file  Food Insecurity: Not on file  Transportation Needs: Not on file  Physical Activity: Not on file  Stress: Not on file  Social Connections: Not on file  Intimate Partner Violence: Not on file   Review of Systems No history of kidney stones No fever    Objective:   Physical Exam Abdominal:     Palpations: Abdomen is soft.     Tenderness: There is no abdominal tenderness. There is no guarding.  Musculoskeletal:     Comments: No spine tenderness Pain area is left flank--between CVA and pelvis. Pain worsened with lying down--and very slightly  tender SLR negative            Assessment & Plan:

## 2022-09-01 NOTE — Addendum Note (Signed)
Addended by: Eual Fines on: 09/01/2022 01:47 PM   Modules accepted: Orders

## 2022-09-02 LAB — URINE CULTURE
MICRO NUMBER:: 14935498
SPECIMEN QUALITY:: ADEQUATE

## 2022-09-23 ENCOUNTER — Ambulatory Visit (AMBULATORY_SURGERY_CENTER): Payer: BC Managed Care – PPO

## 2022-09-23 VITALS — Ht 63.0 in | Wt 141.0 lb

## 2022-09-23 DIAGNOSIS — Z1211 Encounter for screening for malignant neoplasm of colon: Secondary | ICD-10-CM

## 2022-09-23 NOTE — Progress Notes (Signed)

## 2022-10-06 ENCOUNTER — Encounter: Payer: Self-pay | Admitting: Internal Medicine

## 2022-10-21 ENCOUNTER — Encounter: Payer: Self-pay | Admitting: Internal Medicine

## 2022-10-21 ENCOUNTER — Ambulatory Visit: Payer: PRIVATE HEALTH INSURANCE | Admitting: Internal Medicine

## 2022-10-21 VITALS — BP 140/76 | HR 68 | Temp 98.4°F | Resp 15 | Ht 63.0 in | Wt 141.0 lb

## 2022-10-21 DIAGNOSIS — Z1211 Encounter for screening for malignant neoplasm of colon: Secondary | ICD-10-CM

## 2022-10-21 MED ORDER — SODIUM CHLORIDE 0.9 % IV SOLN: 500.0000 mL | INTRAVENOUS | Status: DC

## 2022-10-21 NOTE — Progress Notes (Signed)
Report to PACU, RN, vss, BBS= Clear.  

## 2022-10-21 NOTE — Progress Notes (Signed)
Pt's states no medical or surgical changes since previsit or office visit. 

## 2022-10-21 NOTE — Op Note (Signed)
[facility] Patient Name: Yvonne Hodge Procedure Date: 10/21/2022 7:56 AM MRN: [id number] Endoscopist: Iva Boop , MD, [id number] Age: 62 Referring MD:  Date of Birth: [birth year] Gender: Female Account #: [id number] Procedure:                Colonoscopy Indications:              Screening for colorectal malignant neoplasm, Last                            colonoscopy: 2014 Medicines:                Monitored Anesthesia Care Procedure:                Pre-Anesthesia Assessment:                           - Prior to the procedure, a History and Physical                            was performed, and patient medications and                            allergies were reviewed. The patient's tolerance of                            previous anesthesia was also reviewed. The risks                            and benefits of the procedure and the sedation                            options and risks were discussed with the patient.                            All questions were answered, and informed consent                            was obtained. Prior Anticoagulants: The patient has                            taken no anticoagulant or antiplatelet agents. ASA                            Grade Assessment: II - A patient with mild systemic                            disease. After reviewing the risks and benefits,                            the patient was deemed in satisfactory condition to                            undergo the procedure.  After obtaining informed consent, the colonoscope                            was passed under direct vision. Throughout the                            procedure, the patient's blood pressure, pulse, and                            oxygen saturations were monitored continuously. The                            Olympus CF-HQ190L [id number]) Colonoscope was                            introduced through the anus and advanced to  the the                            cecum, identified by appendiceal orifice and                            ileocecal valve. The colonoscopy was performed                            without difficulty. The patient tolerated the                            procedure well. The quality of the bowel                            preparation was good. The ileocecal valve,                            appendiceal orifice, and rectum were photographed.                            The bowel preparation used was Miralax via split                            dose instruction. Scope In: 8:02:56 AM Scope Out: 8:13:20 AM Scope Withdrawal Time: 0 hours 6 minutes 9 seconds  Total Procedure Duration: 0 hours 10 minutes 24 seconds  Findings:                 The perianal and digital rectal examinations were                            normal.                           Multiple diverticula were found in the sigmoid                            colon.  The exam was otherwise without abnormality on                            direct and retroflexion views. Complications:            No immediate complications. Estimated Blood Loss:     Estimated blood loss: none. Impression:               - Moderate diverticulosis in the sigmoid colon.                           - The examination was otherwise normal on direct                            and retroflexion views.                           - No specimens collected. Recommendation:           - Patient has a contact number available for                            emergencies. The signs and symptoms of potential                            delayed complications were discussed with the                            patient. Return to normal activities tomorrow.                            Written discharge instructions were provided to the                            patient.                           - Resume previous diet.                           -  Continue present medications.                           - Repeat colonoscopy in 10 years for screening                            purposes. Iva Boop, MD 10/21/2022 8:22:13 AM This report has been signed electronically.

## 2022-10-21 NOTE — Patient Instructions (Addendum)
I am pleased to sat - no polyps or cancer again!  Next routine colonoscopy or other screening test in 10 years - 2034.  I appreciate the opportunity to care for you. Iva Boop, MD, [facility] Resume all of your previous medications today.  Read your discharge instructions.    YOU HAD AN ENDOSCOPIC PROCEDURE TODAY AT THE [facility]:   Refer to the procedure report that was given to you for any specific questions about what was found during the examination.  If the procedure report does not answer your questions, please call your gastroenterologist to clarify.  If you requested that your care partner not be given the details of your procedure findings, then the procedure report has been included in a sealed envelope for you to review at your convenience later.  YOU SHOULD EXPECT: Some feelings of bloating in the abdomen. Passage of more gas than usual.  Walking can help get rid of the air that was put into your GI tract during the procedure and reduce the bloating. If you had a lower endoscopy (such as a colonoscopy or flexible sigmoidoscopy) you may notice spotting of blood in your stool or on the toilet paper. If you underwent a bowel prep for your procedure, you may not have a normal bowel movement for a few days.  Please Note:  You might notice some irritation and congestion in your nose or some drainage.  This is from the oxygen used during your procedure.  There is no need for concern and it should clear up in a day or so.  SYMPTOMS TO REPORT IMMEDIATELY:  Following lower endoscopy (colonoscopy or flexible sigmoidoscopy):  Excessive amounts of blood in the stool  Significant tenderness or worsening of abdominal pains  Swelling of the abdomen that is new, acute  Fever of 100F or higher   For urgent or emergent issues, a gastroenterologist can be reached at any hour by calling [phone]. Do not use MyChart messaging for urgent concerns.    DIET:  We do recommend a  small meal at first, but then you may proceed to your regular diet.  Drink plenty of fluids but you should avoid alcoholic beverages for 24 hours.  ACTIVITY:  You should plan to take it easy for the rest of today and you should NOT DRIVE or use heavy machinery until tomorrow (because of the sedation medicines used during the test).    FOLLOW UP: Our staff will call the number listed on your records the next business day following your procedure.  We will call around 7:15- 8:00 am to check on you and address any questions or concerns that you may have regarding the information given to you following your procedure. If we do not reach you, we will leave a message.      SIGNATURES/CONFIDENTIALITY: You and/or your care partner have signed paperwork which will be entered into your electronic medical record.  These signatures attest to the fact that that the information above on your After Visit Summary has been reviewed and is understood.  Full responsibility of the confidentiality of this discharge information lies with you and/or your care-partner.

## 2022-10-21 NOTE — Progress Notes (Signed)
[facility] History and Physical   Primary Care Physician:  Karie Schwalbe, MD   Reason for Procedure:   CRCA screen  Plan:    colonoscopy     HPI: Yvonne Hodge is a 62 y.o. female for screening colonoscopy exam - last as 2014   Past Medical History:  Diagnosis Date   Disorder of bone and cartilage, unspecified    Osteoporosis    Thyroid disease    not on meds since Dec.2013    Past Surgical History:  Procedure Laterality Date   CESAREAN SECTION  04/25/1996   COLONOSCOPY     TUBAL LIGATION  04/25/1996   during C-section    Prior to Admission medications   Medication Sig Start Date End Date Taking? Authorizing Provider  levothyroxine (SYNTHROID) 25 MCG tablet TAKE 1 TABLET BY MOUTH DAILY BEFORE BREAKFAST. 11/15/21  Yes Karie Schwalbe, MD  Multiple Vitamin (MULTI-VITAMIN) tablet Take 1 tablet by mouth daily.   Yes [provider]  alendronate (FOSAMAX) 70 MG tablet Take 70 mg by mouth once a week. 08/28/19   [provider]  cyclobenzaprine (FLEXERIL) 10 MG tablet Take 1 tablet (10 mg total) by mouth at bedtime as needed for muscle spasms. Patient not taking: Reported on 09/23/2022 09/01/22   Karie Schwalbe, MD  Probiotic Product (PROBIOTIC PO) Take 1 capsule by mouth daily. Patient not taking: Reported on 09/23/2022    [provider]    Current Outpatient Medications  Medication Sig Dispense Refill   levothyroxine (SYNTHROID) 25 MCG tablet TAKE 1 TABLET BY MOUTH DAILY BEFORE BREAKFAST. 90 tablet 3   Multiple Vitamin (MULTI-VITAMIN) tablet Take 1 tablet by mouth daily.     alendronate (FOSAMAX) 70 MG tablet Take 70 mg by mouth once a week.     cyclobenzaprine (FLEXERIL) 10 MG tablet Take 1 tablet (10 mg total) by mouth at bedtime as needed for muscle spasms. (Patient not taking: Reported on 09/23/2022) 20 tablet 0   Probiotic Product (PROBIOTIC PO) Take 1 capsule by mouth daily. (Patient not taking: Reported on 09/23/2022)      Current Facility-Administered Medications  Medication Dose Route Frequency Provider Last Rate Last Admin   0.9 %  sodium chloride infusion  500 mL Intravenous Continuous Iva Boop, MD        Allergies as of 10/21/2022 - Review Complete 10/21/2022  Allergen Reaction Noted   Misc. sulfonamide containing compounds Rash 10/29/2021   Sulfonamide derivatives Rash 09/19/2022    Family History  Problem Relation Age of Onset   Cancer Mother        breast cancer   Hypothyroidism Mother    Alzheimer's disease Mother    Hypertension Father    Heart disease Father        atrial fib   Hypothyroidism Sister    Migraines Sister    Fibromyalgia Sister    Cancer Maternal Aunt        breast cancer   Colon cancer Neg Hx    Colon polyps Neg Hx    Esophageal cancer Neg Hx    Rectal cancer Neg Hx    Stomach cancer Neg Hx     Social History   Socioeconomic History   Marital status: Married    Spouse name: Not on file   Number of children: 2   Years of education: Not on file   Highest education level: Not on file  Occupational History   Occupation: Art gallery manager  Tobacco Use  Smoking status: Never   Smokeless tobacco: Never  Vaping Use   Vaping Use: Never used  Substance and Sexual Activity   Alcohol use: No   Drug use: No   Sexual activity: Not on file  Other Topics Concern   Not on file  Social History Narrative   Not on file   Social Determinants of Health   Financial Resource Strain: Not on file  Food Insecurity: Not on file  Transportation Needs: Not on file  Physical Activity: Not on file  Stress: Not on file  Social Connections: Not on file  Intimate Partner Violence: Not on file    Review of Systems:  All other review of systems negative except as mentioned in the HPI.  Physical Exam: Vital signs BP (!) 132/91   Pulse 74   Temp 98.4 F (36.9 C)   Ht 5\' 3"  (1.6 m)   Wt 141 lb (64 kg)   LMP 05/26/2014   SpO2 100%    BMI 24.98 kg/m   General:   Alert,  Well-developed, well-nourished, pleasant and cooperative in NAD Lungs:  Clear throughout to auscultation.   Heart:  Regular rate and rhythm; no murmurs, clicks, rubs,  or gallops. Abdomen:  Soft, nontender and nondistended. Normal bowel sounds.   Neuro/Psych:  Alert and cooperative. Normal mood and affect. A and O x 3   @Ihor Meinzer  Sena Slate, MD, [facility] [phone] (pager) 10/21/2022 7:57 AM@

## 2022-10-24 ENCOUNTER — Telehealth: Payer: Self-pay | Admitting: *Deleted

## 2022-10-24 NOTE — Telephone Encounter (Signed)
  Follow up Call-     10/21/2022    7:09 AM  Call back number  Post procedure Call Back phone  # [phone]  Permission to leave phone message Yes     Patient questions:  Do you have a fever, pain , or abdominal swelling? No. Pain Score  0 *  Have you tolerated food without any problems? Yes.    Have you been able to return to your normal activities? Yes.    Do you have any questions about your discharge instructions: Diet   No. Medications  No. Follow up visit  No.  Do you have questions or concerns about your Care? No.  Actions: * If pain score is 4 or above: No action needed, pain <4.

## 2022-11-01 ENCOUNTER — Encounter: Payer: Self-pay | Admitting: Internal Medicine

## 2022-11-01 ENCOUNTER — Ambulatory Visit (INDEPENDENT_AMBULATORY_CARE_PROVIDER_SITE_OTHER): Payer: PRIVATE HEALTH INSURANCE | Admitting: Internal Medicine

## 2022-11-01 VITALS — BP 126/88 | HR 83 | Temp 97.5°F | Ht 62.5 in | Wt 141.0 lb

## 2022-11-01 DIAGNOSIS — R1011 Right upper quadrant pain: Secondary | ICD-10-CM

## 2022-11-01 DIAGNOSIS — E039 Hypothyroidism, unspecified: Secondary | ICD-10-CM | POA: Diagnosis not present

## 2022-11-01 DIAGNOSIS — M810 Age-related osteoporosis without current pathological fracture: Secondary | ICD-10-CM | POA: Diagnosis not present

## 2022-11-01 DIAGNOSIS — Z0000 Encounter for general adult medical examination without abnormal findings: Secondary | ICD-10-CM | POA: Diagnosis not present

## 2022-11-01 DIAGNOSIS — R109 Unspecified abdominal pain: Secondary | ICD-10-CM

## 2022-11-01 LAB — COMPREHENSIVE METABOLIC PANEL
ALT: 20 U/L (ref 0–35)
AST: 26 U/L (ref 0–37)
Albumin: 3.9 g/dL (ref 3.5–5.2)
Alkaline Phosphatase: 80 U/L (ref 39–117)
BUN: 13 mg/dL (ref 6–23)
CO2: 30 mEq/L (ref 19–32)
Calcium: 9.4 mg/dL (ref 8.4–10.5)
Chloride: 103 mEq/L (ref 96–112)
Creatinine, Ser: 0.73 mg/dL (ref 0.40–1.20)
GFR: 88.2 mL/min (ref >60.00)
Glucose, Bld: 93 mg/dL (ref 70–99)
Potassium: 3.9 mEq/L (ref 3.5–5.1)
Sodium: 140 mEq/L (ref 135–145)
Total Bilirubin: 0.5 mg/dL (ref 0.2–1.2)
Total Protein: 6.3 g/dL (ref 6.0–8.3)

## 2022-11-01 LAB — CBC
HCT: 37.5 % (ref 36.0–46.0)
Hemoglobin: 12.4 g/dL (ref 12.0–15.0)
MCHC: 32.9 g/dL (ref 30.0–36.0)
MCV: 92.1 fl (ref 78.0–100.0)
Platelets: 286 10*3/uL (ref 150.0–400.0)
RBC: 4.08 Mil/uL (ref 3.87–5.11)
RDW: 12.8 % (ref 11.5–15.5)
WBC: 6.5 10*3/uL (ref 4.0–10.5)

## 2022-11-01 LAB — LIPID PANEL
Cholesterol: 186 mg/dL (ref 0–200)
HDL: 48.5 mg/dL (ref >39.00)
LDL Cholesterol: 111 mg/dL — ABNORMAL HIGH (ref 0–99)
NonHDL: 137.81
Total CHOL/HDL Ratio: 4
Triglycerides: 135 mg/dL (ref 0.0–149.0)
VLDL: 27 mg/dL (ref 0.0–40.0)

## 2022-11-01 LAB — T4, FREE: Free T4: 0.63 ng/dL (ref 0.60–1.60)

## 2022-11-01 LAB — TSH: TSH: 4.63 u[IU]/mL (ref 0.35–5.50)

## 2022-11-01 NOTE — Assessment & Plan Note (Signed)
Healthy Discussed fitness Colon due again in 10 years Mammogram scheduled for August Needs last Pap at 65 COVID/flu updates in the fall

## 2022-11-01 NOTE — Assessment & Plan Note (Signed)
Puzzling persistent symptoms Could be stone--but no hematuria Not food related---doubt gallbladder but possible Pleuritic component but nothing to suggest infection ??radicular Will just check CT stone study (no contrast) to be sure nothing that requires action

## 2022-11-01 NOTE — Assessment & Plan Note (Signed)
Seems euthyroid on levothyroxine 25mcg 

## 2022-11-01 NOTE — Assessment & Plan Note (Signed)
Continues on alendronate Needs to stop by 2026

## 2022-11-01 NOTE — Progress Notes (Signed)
Subjective:    Patient ID: Yvonne Hodge, female    DOB: 05-Mar-1961, 62 y.o.   MRN: [id number]  HPI Here for physical  Still having some flank pain Fairly bad and woke her one night---hard to even take a big breath Then improved No cough or fever No hematuria  Sometimes seems in the middle of back--not just flank  Otherwise doing well Walks regularly at work  Current Outpatient Medications on File Prior to Visit  Medication Sig Dispense Refill   alendronate (FOSAMAX) 70 MG tablet Take 70 mg by mouth once a week.     cyclobenzaprine (FLEXERIL) 10 MG tablet Take 1 tablet (10 mg total) by mouth at bedtime as needed for muscle spasms. 20 tablet 0   levothyroxine (SYNTHROID) 25 MCG tablet TAKE 1 TABLET BY MOUTH DAILY BEFORE BREAKFAST. 90 tablet 3   Multiple Vitamin (MULTI-VITAMIN) tablet Take 1 tablet by mouth daily.     Probiotic Product (PROBIOTIC PO) Take 1 capsule by mouth daily. (Patient not taking: Reported on 11/01/2022)     No current facility-administered medications on file prior to visit.    Allergies  Allergen Reactions   Misc. Sulfonamide Containing Compounds Rash   Sulfonamide Derivatives Rash    Past Medical History:  Diagnosis Date   Disorder of bone and cartilage, unspecified    Osteoporosis    Thyroid disease    not on meds since Dec.2013    Past Surgical History:  Procedure Laterality Date   CESAREAN SECTION  04/25/1996   COLONOSCOPY     TUBAL LIGATION  04/25/1996   during C-section    Family History  Problem Relation Age of Onset   Cancer Mother        breast cancer   Hypothyroidism Mother    Alzheimer's disease Mother    Hypertension Father    Heart disease Father        atrial fib   Hypothyroidism Sister    Migraines Sister    Fibromyalgia Sister    Cancer Maternal Aunt        breast cancer   Colon cancer Neg Hx    Colon polyps Neg Hx    Esophageal cancer Neg Hx    Rectal cancer Neg Hx    Stomach cancer Neg Hx     Social History    Socioeconomic History   Marital status: Married    Spouse name: Not on file   Number of children: 2   Years of education: Not on file   Highest education level: Not on file  Occupational History   Occupation: Art gallery manager  Tobacco Use   Smoking status: Never    Passive exposure: Never   Smokeless tobacco: Never  Vaping Use   Vaping Use: Never used  Substance and Sexual Activity   Alcohol use: No   Drug use: No   Sexual activity: Not on file  Other Topics Concern   Not on file  Social History Narrative   Not on file   Social Determinants of Health   Financial Resource Strain: Not on file  Food Insecurity: Not on file  Transportation Needs: Not on file  Physical Activity: Not on file  Stress: Not on file  Social Connections: Not on file  Intimate Partner Violence: Not on file   Review of Systems  Constitutional:  Negative for fatigue and unexpected weight change.       Wears seat belt  HENT:  Negative for dental problem, hearing loss, tinnitus and  trouble swallowing.        Keeps up with dentist  Eyes:  Negative for visual disturbance.       No diplopia or unilateral vision loss  Respiratory:  Negative for cough, chest tightness and shortness of breath.   Cardiovascular:  Negative for chest pain, palpitations and leg swelling.  Gastrointestinal:  Negative for blood in stool and constipation.       Occasional heartburn--tums helps  Endocrine: Negative for polydipsia and polyuria.  Genitourinary:  Positive for dyspareunia. Negative for dysuria and hematuria.       Uses lubricant  Musculoskeletal:  Positive for back pain. Negative for arthralgias and joint swelling.  Skin:  Negative for rash.  Allergic/Immunologic: Negative for environmental allergies and immunocompromised state.  Neurological:  Negative for dizziness, syncope and light-headedness.       Occ headaches--strain from pillow  Hematological:  Negative for adenopathy. Does  not bruise/bleed easily.  Psychiatric/Behavioral:  Negative for dysphoric mood and sleep disturbance. The patient is not nervous/anxious.        Objective:   Physical Exam Constitutional:      Appearance: Normal appearance.  HENT:     Mouth/Throat:     Pharynx: No oropharyngeal exudate or posterior oropharyngeal erythema.  Eyes:     Conjunctiva/sclera: Conjunctivae normal.     Pupils: Pupils are equal, round, and reactive to light.  Cardiovascular:     Rate and Rhythm: Normal rate and regular rhythm.     Pulses: Normal pulses.     Heart sounds: No murmur heard.    No gallop.  Pulmonary:     Effort: Pulmonary effort is normal.     Breath sounds: Normal breath sounds. No wheezing or rales.  Abdominal:     Palpations: Abdomen is soft.     Tenderness: There is no abdominal tenderness.  Musculoskeletal:     Cervical back: Neck supple.     Right lower leg: No edema.     Left lower leg: No edema.  Lymphadenopathy:     Cervical: No cervical adenopathy.  Skin:    Findings: No rash.  Neurological:     General: No focal deficit present.     Mental Status: She is alert and oriented to person, place, and time.  Psychiatric:        Mood and Affect: Mood normal.        Behavior: Behavior normal.            Assessment & Plan:

## 2022-11-04 ENCOUNTER — Encounter: Payer: Self-pay | Admitting: *Deleted

## 2022-12-04 ENCOUNTER — Ambulatory Visit
Admission: EM | Admit: 2022-12-04 | Discharge: 2022-12-04 | Disposition: A | Discharge status: Home / Self Care | Payer: PRIVATE HEALTH INSURANCE | Attending: Emergency Medicine | Admitting: Emergency Medicine

## 2022-12-04 DIAGNOSIS — J0110 Acute frontal sinusitis, unspecified: Secondary | ICD-10-CM | POA: Diagnosis not present

## 2022-12-04 DIAGNOSIS — Z1152 Encounter for screening for COVID-19: Secondary | ICD-10-CM | POA: Insufficient documentation

## 2022-12-04 DIAGNOSIS — J069 Acute upper respiratory infection, unspecified: Secondary | ICD-10-CM | POA: Insufficient documentation

## 2022-12-04 DIAGNOSIS — R059 Cough, unspecified: Secondary | ICD-10-CM | POA: Diagnosis present

## 2022-12-04 MED ORDER — AMOXICILLIN 875 MG PO TABS: 875.0000 mg | ORAL_TABLET | Freq: Two times a day (BID) | ORAL | 0 refills | Status: AC

## 2022-12-04 NOTE — ED Provider Notes (Signed)
Renaldo Fiddler    CSN: [id number] Arrival date & time: 12/04/22  0909      History   Chief Complaint Chief Complaint  Patient presents with   Cough   Sore Throat   Facial Pain    HPI Yvonne Hodge is a 62 y.o. female.  Patient presents with 5-day history of sinus pressure, congestion, postnasal drip, runny nose, scratchy throat, cough.  She has been treating her symptoms with Benadryl and Mucinex.  No fever, shortness of breath, or other symptoms.  Her medical history includes osteoporosis and thyroid disease.  The history is provided by the patient and medical records.    Past Medical History:  Diagnosis Date   Disorder of bone and cartilage, unspecified    Osteoporosis    Thyroid disease    not on meds since Dec.2013    Patient Active Problem List   Diagnosis Date Noted   Right flank pain 09/01/2022   Elevated blood pressure reading without diagnosis of hypertension 09/08/2021   Acute low back pain without sciatica 04/22/2020   Recurrent cystitis 12/26/2019   Preventative health care 10/22/2018   Hypothyroidism 01/09/2007   Osteoporosis 01/09/2007    Past Surgical History:  Procedure Laterality Date   CESAREAN SECTION  04/25/1996   COLONOSCOPY     TUBAL LIGATION  04/25/1996   during C-section    OB History   No obstetric history on file.      Home Medications    Prior to Admission medications   Medication Sig Start Date End Date Taking? Authorizing Provider  amoxicillin (AMOXIL) 875 MG tablet Take 1 tablet (875 mg total) by mouth 2 (two) times daily for 10 days. 12/04/22 12/14/22 Yes Mickie Bail, NP  alendronate (FOSAMAX) 70 MG tablet Take 70 mg by mouth once a week. 08/28/19   [provider]  cyclobenzaprine (FLEXERIL) 10 MG tablet Take 1 tablet (10 mg total) by mouth at bedtime as needed for muscle spasms. 09/01/22   Karie Schwalbe, MD  levothyroxine (SYNTHROID) 25 MCG tablet TAKE 1 TABLET BY MOUTH DAILY BEFORE BREAKFAST. 11/15/21    Karie Schwalbe, MD  Multiple Vitamin (MULTI-VITAMIN) tablet Take 1 tablet by mouth daily.    [provider]  Probiotic Product (PROBIOTIC PO) Take 1 capsule by mouth daily. Patient not taking: Reported on 11/01/2022    [provider]    Family History Family History  Problem Relation Age of Onset   Cancer Mother        breast cancer   Hypothyroidism Mother    Alzheimer's disease Mother    Hypertension Father    Heart disease Father        atrial fib   Hypothyroidism Sister    Migraines Sister    Fibromyalgia Sister    Cancer Maternal Aunt        breast cancer   Colon cancer Neg Hx    Colon polyps Neg Hx    Esophageal cancer Neg Hx    Rectal cancer Neg Hx    Stomach cancer Neg Hx     Social History Social History   Tobacco Use   Smoking status: Never    Passive exposure: Never   Smokeless tobacco: Never  Vaping Use   Vaping status: Never Used  Substance Use Topics   Alcohol use: No   Drug use: No     Allergies   Misc. sulfonamide containing compounds and Sulfonamide derivatives   Review of Systems Review of Systems  Constitutional:  Negative for chills and fever.  HENT:  Positive for congestion, postnasal drip, rhinorrhea, sinus pressure and sore throat. Negative for ear pain.   Respiratory:  Positive for cough. Negative for shortness of breath.   Cardiovascular:  Negative for chest pain and palpitations.     Physical Exam Triage Vital Signs ED Triage Vitals  Encounter Vitals Group     BP      Systolic BP Percentile      Diastolic BP Percentile      Pulse      Resp      Temp      Temp src      SpO2      Weight      Height      Head Circumference      Peak Flow      Pain Score      Pain Loc      Pain Education      Exclude from Growth Chart    No data found.  Updated Vital Signs BP 132/88 (BP Location: Left Arm)   Pulse (!) 102   Temp 98.7 F (37.1 C)   Resp 18   LMP 05/26/2014   SpO2 96%   Visual Acuity Right  Eye Distance:   Left Eye Distance:   Bilateral Distance:    Right Eye Near:   Left Eye Near:    Bilateral Near:     Physical Exam Vitals and nursing note reviewed.  Constitutional:      General: She is not in acute distress.    Appearance: She is well-developed.  HENT:     Right Ear: Tympanic membrane normal.     Left Ear: Tympanic membrane normal.     Nose: Congestion and rhinorrhea present.     Mouth/Throat:     Mouth: Mucous membranes are moist.     Pharynx: Oropharynx is clear.  Cardiovascular:     Rate and Rhythm: Normal rate and regular rhythm.     Heart sounds: Normal heart sounds.  Pulmonary:     Effort: Pulmonary effort is normal. No respiratory distress.     Breath sounds: Normal breath sounds.  Musculoskeletal:     Cervical back: Neck supple.  Skin:    General: Skin is warm and dry.  Neurological:     Mental Status: She is alert.      UC Treatments / Results  Labs (all labs ordered are listed, but only abnormal results are displayed) Labs Reviewed  SARS CORONAVIRUS 2 (TAT 6-24 HRS)    EKG   Radiology No results found.  Procedures Procedures (including critical care time)  Medications Ordered in UC Medications - No data to display  Initial Impression / Assessment and Plan / UC Course  I have reviewed the triage vital signs and the nursing notes.  Pertinent labs & imaging results that were available during my care of the patient were reviewed by me and considered in my medical decision making (see chart for details).   Acute sinusitis, upper respiratory infection.  Patient had negative COVID test at home yesterday but request PCR COVID today.  If COVID test is positive, recommend continued symptomatic treatment.  Treating sinusitis with amoxicillin.  Discussed symptomatic treatment including Tylenol, Mucinex, rest, hydration.  Instructed patient to follow up with her PCP if her symptoms are not improving.  She agrees to plan of care.   Final  Clinical Impressions(s) / UC Diagnoses   Final diagnoses:  Acute non-recurrent frontal  sinusitis  Acute upper respiratory infection     Discharge Instructions      Take the amoxicillin as directed.    Your COVID test is pending.    Take Tylenol as needed for fever or discomfort.    Follow-up with your primary care provider if your symptoms are not improving.         ED Prescriptions     Medication Sig Dispense Auth. Provider   amoxicillin (AMOXIL) 875 MG tablet Take 1 tablet (875 mg total) by mouth 2 (two) times daily for 10 days. 20 tablet Mickie Bail, NP      PDMP not reviewed this encounter.   Mickie Bail, NP 12/04/22 [phone]

## 2022-12-04 NOTE — Discharge Instructions (Addendum)
Take the amoxicillin as directed.    Your COVID test is pending.    Take Tylenol as needed for fever or discomfort.    Follow-up with your primary care provider if your symptoms are not improving.

## 2022-12-04 NOTE — ED Triage Notes (Signed)
Patient presents to UC for sinus pressure, cough, scratchy throat x 4 days. Negative at home COVID test. Treating symptoms with benadryl and mucinex.   Denies fever.

## 2022-12-05 LAB — HM PAP SMEAR

## 2022-12-12 ENCOUNTER — Ambulatory Visit: Payer: PRIVATE HEALTH INSURANCE

## 2022-12-30 ENCOUNTER — Other Ambulatory Visit: Payer: Self-pay | Admitting: Internal Medicine

## 2023-06-19 ENCOUNTER — Encounter: Payer: Self-pay | Admitting: Internal Medicine

## 2023-06-19 ENCOUNTER — Ambulatory Visit (INDEPENDENT_AMBULATORY_CARE_PROVIDER_SITE_OTHER): Payer: PRIVATE HEALTH INSURANCE | Admitting: Internal Medicine

## 2023-06-19 ENCOUNTER — Ambulatory Visit: Payer: Self-pay | Admitting: Internal Medicine

## 2023-06-19 VITALS — BP 138/86 | HR 77 | Temp 98.1°F | Ht 62.5 in | Wt 145.0 lb

## 2023-06-19 DIAGNOSIS — M898X1 Other specified disorders of bone, shoulder: Secondary | ICD-10-CM | POA: Diagnosis not present

## 2023-06-19 NOTE — Telephone Encounter (Signed)
  Chief Complaint: Left shoulder pain Symptoms: above - pain as high as 8/10.  Symptoms do get better with rest Frequency: Friday Pertinent Negatives: Patient denies Chest pain, SOB, Any heart issues. Disposition: [] ED /[] Urgent Care (no appt availability in office) / [x] Appointment(In office/virtual)/ []  [facility]/ [] Home Care/ [] Refused Recommended Disposition /[] [facility]/ []  Follow-up with PCP Additional Notes: Pt reports left shoulder pain since Friday. Pt has tried muscled relaxer, heating pad and otc topical medications w/o relief. Appt for today    Copied from CRM [id number]. Topic: Clinical - Red Word Triage >> Jun 19, 2023  8:04 AM Gaetano Hawthorne wrote: Red Word that prompted transfer to Nurse Triage: Patient has been experience some pain around her left shoulder blade since Friday - she has tired placing creams and had some muscle relaxors, it didn't seem to help much - patient describe it as very achy, she can still move her arm around, it gets worse as she gets up and does things. Reason for Disposition  [1] Shoulder pains with exertion (e.g., walking) AND [2] pain goes away on resting AND [3] not present now  Answer Assessment - Initial Assessment Questions 1. ONSET: "When did the pain start?"     Friday 2. LOCATION: "Where is the pain located?"     Left shoulder blade 3. PAIN: "How bad is the pain?" (Scale 1-10; or mild, moderate, severe)   - MILD (1-3): doesn't interfere with normal activities   - MODERATE (4-7): interferes with normal activities (e.g., work or school) or awakens from sleep   - SEVERE (8-10): excruciating pain, unable to do any normal activities, unable to move arm at all due to pain     8/10 - aches 4. WORK OR EXERCISE: "Has there been any recent work or exercise that involved this part of the body?"     no 5. CAUSE: "What do you think is causing the shoulder pain?"     No idea 6. OTHER SYMPTOMS: "Do you have any other symptoms?" (e.g.,  neck pain, swelling, rash, fever, numbness, weakness)     Pain comes and goes, with rest it gets better.  Protocols used: Shoulder Pain-A-AH

## 2023-06-19 NOTE — Progress Notes (Signed)
 Subjective:    Patient ID: Yvonne Hodge, female    DOB: Mar 21, 1961, 63 y.o.   MRN: [id number]  HPI Here due to pain at left shoulder blade  Started 3 days ago--probably after work Feels it is aching No fall or lifting  Not sick No fever, cough or SOB  Dull constant pain--does get worse with moving around or doing or shopping Heating pad doesn't help Tried aleve--not much help Topical icy hot/aspercreme---not much help Tried cyclobenzaprine for sleep---does feel better when lying down  Current Outpatient Medications on File Prior to Visit  Medication Sig Dispense Refill   alendronate (FOSAMAX) 70 MG tablet Take 70 mg by mouth once a week.     cyclobenzaprine (FLEXERIL) 10 MG tablet Take 1 tablet (10 mg total) by mouth at bedtime as needed for muscle spasms. 20 tablet 0   levothyroxine (SYNTHROID) 25 MCG tablet TAKE 1 TABLET BY MOUTH EVERY DAY BEFORE BREAKFAST 90 tablet 3   Multiple Vitamin (MULTI-VITAMIN) tablet Take 1 tablet by mouth daily.     Probiotic Product (PROBIOTIC PO) Take 1 capsule by mouth daily.     No current facility-administered medications on file prior to visit.    Allergies  Allergen Reactions   Misc. Sulfonamide Containing Compounds Rash   Sulfonamide Derivatives Rash    Past Medical History:  Diagnosis Date   Disorder of bone and cartilage, unspecified    Osteoporosis    Thyroid disease    not on meds since Dec.2013    Past Surgical History:  Procedure Laterality Date   CESAREAN SECTION  04/25/1996   COLONOSCOPY     TUBAL LIGATION  04/25/1996   during C-section    Family History  Problem Relation Age of Onset   Cancer Mother        breast cancer   Hypothyroidism Mother    Alzheimer's disease Mother    Hypertension Father    Heart disease Father        atrial fib   Hypothyroidism Sister    Migraines Sister    Fibromyalgia Sister    Cancer Maternal Aunt        breast cancer   Colon cancer Neg Hx    Colon polyps Neg Hx     Esophageal cancer Neg Hx    Rectal cancer Neg Hx    Stomach cancer Neg Hx     Social History   Socioeconomic History   Marital status: Married    Spouse name: Not on file   Number of children: 2   Years of education: Not on file   Highest education level: Not on file  Occupational History   Occupation: Art gallery manager  Tobacco Use   Smoking status: Never    Passive exposure: Never   Smokeless tobacco: Never  Vaping Use   Vaping status: Never Used  Substance and Sexual Activity   Alcohol use: No   Drug use: No   Sexual activity: Not on file  Other Topics Concern   Not on file  Social History Narrative   Not on file   Social Drivers of Health   Financial Resource Strain: Not on file  Food Insecurity: Not on file  Transportation Needs: Not on file  Physical Activity: Not on file  Stress: Not on file  Social Connections: Not on file  Intimate Partner Violence: Not on file   Review of Systems No N/V Eating okay Bowels move fine Did notice some red spots in past 2 days ---left  arm and hip and darker spot on right calf    Objective:   Physical Exam Constitutional:      Appearance: Normal appearance.  Musculoskeletal:     Comments: Some pain over lower lateral left scapula with pulling arm towards right side Otherwise, full ROM in left shoulder without pain No clear scapular tenderness  Skin:    Comments: 2 red areas on left arm, over left hip and one darker area on right calf. These all look like bites more than anything else  Neurological:     Mental Status: She is alert.            Assessment & Plan:

## 2023-06-19 NOTE — Assessment & Plan Note (Signed)
 No worrisome features ---no evidence of pulmonary symptoms and the rash doesn't seem related No trauma and not tender (just pain with pulling left arm towards the other side) Can continue to try the aleve---2 bid would be recommended ?lidocaine patch If ongoing symptoms, would recommend evaluation at ortho urgent care

## 2023-06-19 NOTE — Telephone Encounter (Signed)
 Okay---will assess at today's visit

## 2023-11-03 ENCOUNTER — Ambulatory Visit: Payer: Self-pay | Admitting: Internal Medicine

## 2023-11-03 ENCOUNTER — Encounter: Payer: Self-pay | Admitting: Internal Medicine

## 2023-11-03 ENCOUNTER — Ambulatory Visit (INDEPENDENT_AMBULATORY_CARE_PROVIDER_SITE_OTHER): Payer: PRIVATE HEALTH INSURANCE | Admitting: Internal Medicine

## 2023-11-03 VITALS — BP 112/78 | HR 86 | Temp 97.8°F | Ht 62.25 in | Wt 141.0 lb

## 2023-11-03 DIAGNOSIS — Z0000 Encounter for general adult medical examination without abnormal findings: Secondary | ICD-10-CM | POA: Diagnosis not present

## 2023-11-03 DIAGNOSIS — M810 Age-related osteoporosis without current pathological fracture: Secondary | ICD-10-CM | POA: Diagnosis not present

## 2023-11-03 DIAGNOSIS — E039 Hypothyroidism, unspecified: Secondary | ICD-10-CM | POA: Diagnosis not present

## 2023-11-03 LAB — COMPREHENSIVE METABOLIC PANEL WITH GFR
ALT: 21 U/L (ref 0–35)
AST: 24 U/L (ref 0–37)
Albumin: 4.4 g/dL (ref 3.5–5.2)
Alkaline Phosphatase: 74 U/L (ref 39–117)
BUN: 21 mg/dL (ref 6–23)
CO2: 31 meq/L (ref 19–32)
Calcium: 9.4 mg/dL (ref 8.4–10.5)
Chloride: 103 meq/L (ref 96–112)
Creatinine, Ser: 0.75 mg/dL (ref 0.40–1.20)
GFR: 84.79 mL/min (ref >60.00)
Glucose, Bld: 94 mg/dL (ref 70–99)
Potassium: 4.2 meq/L (ref 3.5–5.1)
Sodium: 139 meq/L (ref 135–145)
Total Bilirubin: 0.4 mg/dL (ref 0.2–1.2)
Total Protein: 6.8 g/dL (ref 6.0–8.3)

## 2023-11-03 LAB — CBC
HCT: 39.7 % (ref 36.0–46.0)
Hemoglobin: 13.5 g/dL (ref 12.0–15.0)
MCHC: 34.1 g/dL (ref 30.0–36.0)
MCV: 90.4 fl (ref 78.0–100.0)
Platelets: 234 K/uL (ref 150.0–400.0)
RBC: 4.39 Mil/uL (ref 3.87–5.11)
RDW: 12.8 % (ref 11.5–15.5)
WBC: 4.9 K/uL (ref 4.0–10.5)

## 2023-11-03 LAB — TSH: TSH: 4.18 u[IU]/mL (ref 0.35–5.50)

## 2023-11-03 LAB — T4, FREE: Free T4: 0.72 ng/dL (ref 0.60–1.60)

## 2023-11-03 NOTE — Assessment & Plan Note (Signed)
 Euthyroid on Rx

## 2023-11-03 NOTE — Assessment & Plan Note (Signed)
 Ran out of alendronate after 4 years--reasonable to stop and then follow up over time

## 2023-11-03 NOTE — Progress Notes (Signed)
 Subjective:    Patient ID: Yvonne Hodge, female    DOB: 1960-07-24, 63 y.o.   MRN: [id number]  HPI Here for physical  Doing okay Same job--thinking about retirement Walks some---and exercise bike  Current Outpatient Medications on File Prior to Visit  Medication Sig Dispense Refill   levothyroxine  (SYNTHROID ) 25 MCG tablet TAKE 1 TABLET BY MOUTH EVERY DAY BEFORE BREAKFAST 90 tablet 3   Multiple Vitamin (MULTI-VITAMIN) tablet Take 1 tablet by mouth daily.     alendronate (FOSAMAX) 70 MG tablet Take 70 mg by mouth once a week. (Patient not taking: Reported on 11/03/2023)     No current facility-administered medications on file prior to visit.    Allergies  Allergen Reactions   Misc. Sulfonamide Containing Compounds Rash   Sulfonamide Derivatives Rash    Past Medical History:  Diagnosis Date   Disorder of bone and cartilage, unspecified    Osteoporosis    Thyroid  disease    not on meds since Dec.2013    Past Surgical History:  Procedure Laterality Date   CESAREAN SECTION  04/25/1996   COLONOSCOPY     TUBAL LIGATION  04/25/1996   during C-section    Family History  Problem Relation Age of Onset   Cancer Mother        breast cancer   Hypothyroidism Mother    Alzheimer's disease Mother    Hypertension Father    Heart disease Father        atrial fib   Hypothyroidism Sister    Migraines Sister    Fibromyalgia Sister    Cancer Maternal Aunt        breast cancer   Colon cancer Neg Hx    Colon polyps Neg Hx    Esophageal cancer Neg Hx    Rectal cancer Neg Hx    Stomach cancer Neg Hx     Social History   Socioeconomic History   Marital status: Married    Spouse name: Not on file   Number of children: 2   Years of education: Not on file   Highest education level: Not on file  Occupational History   Occupation: Art gallery manager  Tobacco Use   Smoking status: Never    Passive exposure: Never   Smokeless tobacco: Never  Vaping  Use   Vaping status: Never Used  Substance and Sexual Activity   Alcohol use: No   Drug use: No   Sexual activity: Not on file  Other Topics Concern   Not on file  Social History Narrative   Not on file   Social Drivers of Health   Financial Resource Strain: Not on file  Food Insecurity: Not on file  Transportation Needs: Not on file  Physical Activity: Not on file  Stress: Not on file  Social Connections: Not on file  Intimate Partner Violence: Not on file   Review of Systems  Constitutional:  Negative for fatigue and unexpected weight change.       Wears seat belt  HENT:  Negative for dental problem, hearing loss and tinnitus.        Keeps up with dentist  Eyes:  Negative for visual disturbance.       No diplopia or unilateral vision loss  Respiratory:  Negative for cough, chest tightness and shortness of breath.   Cardiovascular:  Negative for chest pain, palpitations and leg swelling.  Gastrointestinal:  Negative for blood in stool and constipation.       Rare heartburn---  takes famotidine  Endocrine: Negative for polydipsia and polyuria.  Genitourinary:  Positive for dyspareunia. Negative for dysuria and hematuria.       Lubricants help  Musculoskeletal:  Negative for arthralgias and joint swelling.       Mild back pain---heating pad helps  Skin:  Negative for rash.       Overdue for derm visit  Allergic/Immunologic: Negative for immunocompromised state.       Mild symptoms---no meds  Neurological:  Negative for dizziness, syncope and light-headedness.       Some headaches---from pillow (though she gets conforming pillows). BCs help  Hematological:  Negative for adenopathy. Does not bruise/bleed easily.  Psychiatric/Behavioral:  Negative for dysphoric mood and sleep disturbance. The patient is not nervous/anxious.        Objective:   Physical Exam Constitutional:      Appearance: Normal appearance.  HENT:     Mouth/Throat:     Pharynx: No oropharyngeal  exudate or posterior oropharyngeal erythema.  Eyes:     Conjunctiva/sclera: Conjunctivae normal.     Pupils: Pupils are equal, round, and reactive to light.  Cardiovascular:     Rate and Rhythm: Normal rate and regular rhythm.     Pulses: Normal pulses.     Heart sounds: No murmur heard.    No gallop.  Pulmonary:     Effort: Pulmonary effort is normal.     Breath sounds: Normal breath sounds. No wheezing or rales.  Abdominal:     Palpations: Abdomen is soft.     Tenderness: There is no abdominal tenderness.  Musculoskeletal:     Cervical back: Neck supple.     Right lower leg: No edema.     Left lower leg: No edema.  Lymphadenopathy:     Cervical: No cervical adenopathy.  Skin:    Findings: No rash.  Neurological:     General: No focal deficit present.     Mental Status: She is alert and oriented to person, place, and time.  Psychiatric:        Mood and Affect: Mood normal.        Behavior: Behavior normal.            Assessment & Plan:

## 2023-11-03 NOTE — Assessment & Plan Note (Signed)
 Healthy Discussed adding resistance work Mammogram every August at gyn Last pap at 63 Colon due 2034

## 2023-12-25 ENCOUNTER — Ambulatory Visit
Admission: RE | Admit: 2023-12-25 | Discharge: 2023-12-25 | Disposition: A | Discharge status: Home / Self Care | Payer: PRIVATE HEALTH INSURANCE

## 2023-12-25 VITALS — BP 138/89 | HR 83 | Temp 97.6°F | Resp 17 | Ht 62.0 in | Wt 140.0 lb

## 2023-12-25 DIAGNOSIS — U071 COVID-19: Secondary | ICD-10-CM

## 2023-12-25 LAB — POC SOFIA SARS ANTIGEN FIA: SARS Coronavirus 2 Ag: POSITIVE — AB

## 2023-12-25 NOTE — ED Triage Notes (Signed)
 X3 days Pt states that she has a headache, scratchy throat, facial pain and nasal congestion.

## 2023-12-25 NOTE — ED Provider Notes (Signed)
 [facility]    CSN: [id number] Arrival date & time: 12/25/23  1443      History   Chief Complaint Chief Complaint  Patient presents with   Nasal Congestion    Sinus infection - Entered by patient    HPI Yvonne Hodge is a 63 y.o. female.  Patient presents with 2 to 3 day history of congestion, sinus pressure, scratchy throat, mild cough, headache.  No fever, shortness of breath, vomiting, diarrhea.  She has been treating her symptoms with OTC cold and allergy medications.    The history is provided by the patient and medical records.    Past Medical History:  Diagnosis Date   Disorder of bone and cartilage, unspecified    Osteoporosis    Thyroid  disease    not on meds since Dec.2013    Patient Active Problem List   Diagnosis Date Noted   Recurrent cystitis 12/26/2019   Preventative health care 10/22/2018   Hypothyroidism 01/09/2007   Osteoporosis 01/09/2007    Past Surgical History:  Procedure Laterality Date   CESAREAN SECTION  04/25/1996   COLONOSCOPY     TUBAL LIGATION  04/25/1996   during C-section    OB History   No obstetric history on file.      Home Medications    Prior to Admission medications   Medication Sig Start Date End Date Taking? Authorizing Provider  alendronate (FOSAMAX) 70 MG tablet TAKE 1 TABLET BY MOUTH ONE TIME PER WEEK   Yes [provider]  levothyroxine  (SYNTHROID ) 25 MCG tablet TAKE 1 TABLET BY MOUTH EVERY DAY BEFORE BREAKFAST 12/30/22  Yes Jimmy Charlie FERNS, MD  Multiple Vitamin (MULTI-VITAMIN) tablet Take 1 tablet by mouth daily.   Yes [provider]    Family History Family History  Problem Relation Age of Onset   Cancer Mother        breast cancer   Hypothyroidism Mother    Alzheimer's disease Mother    Hypertension Father    Heart disease Father        atrial fib   Hypothyroidism Sister    Migraines Sister    Fibromyalgia Sister    Cancer Maternal Aunt        breast cancer   Colon  cancer Neg Hx    Colon polyps Neg Hx    Esophageal cancer Neg Hx    Rectal cancer Neg Hx    Stomach cancer Neg Hx     Social History Social History   Tobacco Use   Smoking status: Never    Passive exposure: Never   Smokeless tobacco: Never  Vaping Use   Vaping status: Never Used  Substance Use Topics   Alcohol use: No   Drug use: No     Allergies   Misc. sulfonamide containing compounds and Sulfonamide derivatives   Review of Systems Review of Systems  Constitutional:  Negative for chills and fever.  HENT:  Positive for congestion, postnasal drip, sinus pressure and sore throat. Negative for ear pain.   Respiratory:  Positive for cough. Negative for shortness of breath.   Gastrointestinal:  Negative for diarrhea and vomiting.  Neurological:  Positive for headaches.     Physical Exam Triage Vital Signs ED Triage Vitals [12/25/23 1456]  Encounter Vitals Group     BP      Girls Systolic BP Percentile      Girls Diastolic BP Percentile      Boys Systolic BP Percentile  Boys Diastolic BP Percentile      Pulse      Resp      Temp      Temp src      SpO2      Weight 140 lb (63.5 kg)     Height 5' 2 (1.575 m)     Head Circumference      Peak Flow      Pain Score 8     Pain Loc      Pain Education      Exclude from Growth Chart    No data found.  Updated Vital Signs BP 138/89 (BP Location: Left Arm)   Pulse 83   Temp 97.6 F (36.4 C) (Temporal)   Resp 17   Ht 5' 2 (1.575 m)   Wt 140 lb (63.5 kg)   LMP 05/26/2014   SpO2 95%   BMI 25.61 kg/m   Visual Acuity Right Eye Distance:   Left Eye Distance:   Bilateral Distance:    Right Eye Near:   Left Eye Near:    Bilateral Near:     Physical Exam Constitutional:      General: She is not in acute distress. HENT:     Right Ear: Tympanic membrane normal.     Left Ear: Tympanic membrane normal.     Nose: Congestion present.     Mouth/Throat:     Mouth: Mucous membranes are moist.      Pharynx: Oropharynx is clear.  Cardiovascular:     Rate and Rhythm: Normal rate and regular rhythm.     Heart sounds: Normal heart sounds.  Pulmonary:     Effort: Pulmonary effort is normal. No respiratory distress.     Breath sounds: Normal breath sounds.  Neurological:     Mental Status: She is alert.      UC Treatments / Results  Labs (all labs ordered are listed, but only abnormal results are displayed) Labs Reviewed  POC SOFIA SARS ANTIGEN FIA - Abnormal; Notable for the following components:      Result Value   SARS Coronavirus 2 Ag Positive (*)    All other components within normal limits    EKG   Radiology No results found.  Procedures Procedures (including critical care time)  Medications Ordered in UC Medications - No data to display  Initial Impression / Assessment and Plan / UC Course  I have reviewed the triage vital signs and the nursing notes.  Pertinent labs & imaging results that were available during my care of the patient were reviewed by me and considered in my medical decision making (see chart for details).    COVID-19.  Afebrile and vital signs are stable.  Rapid COVID is positive.  Discussed symptomatic treatment including Tylenol or ibuprofen as needed for fever or discomfort, plain Mucinex as needed for congestion, rest, hydration.  Instructed patient to follow-up with her PCP if not improving.  ED precautions given.  Patient agrees to plan of care.  Final Clinical Impressions(s) / UC Diagnoses   Final diagnoses:  COVID-19     Discharge Instructions      Your COVID test is positive.   Take Tylenol or ibuprofen as needed for fever or discomfort.  Take plain Mucinex as needed for congestion.  Rest and keep yourself hydrated.    Follow-up with your primary care provider.  Go to the emergency department if you have worsening symptoms.          ED Prescriptions  None    PDMP not reviewed this encounter.   Corlis Burnard DEL,  NP 12/25/23 1525

## 2023-12-25 NOTE — Discharge Instructions (Addendum)
 Your COVID test is positive.    Take Tylenol  or ibuprofen as needed for fever or discomfort.  Take plain Mucinex as needed for congestion.  Rest and keep yourself hydrated.    Follow up with your primary care provider.  Go to the emergency department if you have worsening symptoms.

## 2024-03-07 ENCOUNTER — Other Ambulatory Visit: Payer: Self-pay | Admitting: Internal Medicine

## 2024-03-07 NOTE — Telephone Encounter (Unsigned)
 Copied from CRM #[id number]. Topic: Clinical - Medication Refill >> Mar 07, 2024  2:46 PM Aisha D wrote: Medication: levothyroxine  (SYNTHROID ) 25 MCG tablet  Has the patient contacted their pharmacy? Yes (Agent: If no, request that the patient contact the pharmacy for the refill. If patient does not wish to contact the pharmacy document the reason why and proceed with request.) (Agent: If yes, when and what did the pharmacy advise?)  This is the patient's preferred pharmacy:  [facility] [phone] [facility],  - [address] [place] [address] Phone: [phone] Fax: [phone]  Is this the correct pharmacy for this prescription? Yes If no, delete pharmacy and type the correct one.   Has the prescription been filled recently? No  Is the patient out of the medication? No  Has the patient been seen for an appointment in the last year OR does the patient have an upcoming appointment? Yes  Can we respond through MyChart? Yes  Agent: Please be advised that Rx refills may take up to 3 business days. We ask that you follow-up with your pharmacy.

## 2024-03-08 MED ORDER — LEVOTHYROXINE SODIUM 25 MCG PO TABS: 25.0000 ug | ORAL_TABLET | Freq: Every day | ORAL | 1 refills | Status: AC
